# Patient Record
Sex: Female | Born: 1990 | Race: Black or African American | Hispanic: No | Marital: Single | Smoking: Never smoker
Health system: Southern US, Community
[De-identification: ages and names within clinical notes are randomized; demographics above are authoritative.]

## PROBLEM LIST (undated history)

## (undated) DIAGNOSIS — D649 Anemia, unspecified: Secondary | ICD-10-CM

## (undated) HISTORY — PX: THERAPEUTIC ABORTION: SHX798

---
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
## 2012-06-18 ENCOUNTER — Emergency Department (HOSPITAL_COMMUNITY)
Admission: EM | Admit: 2012-06-18 | Discharge: 2012-06-18 | Disposition: A | Discharge status: Home / Self Care | Payer: BC Managed Care – PPO | Attending: Emergency Medicine | Admitting: Emergency Medicine

## 2012-06-18 ENCOUNTER — Encounter (HOSPITAL_COMMUNITY): Payer: Self-pay | Admitting: *Deleted

## 2012-06-18 DIAGNOSIS — K0889 Other specified disorders of teeth and supporting structures: Secondary | ICD-10-CM

## 2012-06-18 DIAGNOSIS — K029 Dental caries, unspecified: Secondary | ICD-10-CM | POA: Insufficient documentation

## 2012-06-18 MED ORDER — HYDROCODONE-ACETAMINOPHEN 5-325 MG PO TABS: 1.0000 | ORAL_TABLET | Freq: Four times a day (QID) | ORAL | Status: DC | PRN

## 2012-06-18 MED ORDER — CLINDAMYCIN HCL 150 MG PO CAPS: 150.0000 mg | ORAL_CAPSULE | Freq: Four times a day (QID) | ORAL | Status: DC

## 2012-06-18 NOTE — ED Notes (Signed)
Pt st's now pain has subsided and wants to know if she should just leave.  Encouraged pt to stay due to possibility of pain returning during the night.  Pt voices understanding and st's she will stay.

## 2012-06-18 NOTE — ED Provider Notes (Signed)
History  This chart was scribed for Shelda Jakes, MD by Shari Heritage. The patient was seen in room TR02C/TR02C. Patient's care was started at 2055.     CSN: [id number]  Arrival date & time 06/18/12  2023   First MD Initiated Contact with Patient 06/18/12 2055      Chief Complaint  Patient presents with  . Dental Pain    Patient is a 21 y.o. female presenting with tooth pain. The history is provided by the patient. No language interpreter was used.  Dental PainThe primary symptoms include mouth pain, dental injury and headaches. Primary symptoms do not include fever or shortness of breath. The symptoms began yesterday. The symptoms are unchanged. The symptoms are recurrent. The symptoms occur constantly.  Mouth pain began 24 -48 hours ago. Mouth pain occurs constantly. Mouth pain is worsening. Affected locations include: teeth.  Additional symptoms do not include: gum swelling and jaw pain. Medical issues do not include: smoking.    Denise Terry is a 21 y.o. female who presents to the Emergency Department complaining of moderate, constant left lower tooth pain that radiates to the neck and and head onset 1 day ago. Pateint states that she has a known crack in her tooth and needs to have the tooth pulled. She states that she applied Orajel and took Aleve 250 mg and is now having moderate relief from the pain. Patient denies fever, SOB, chest pain and abdominal pain. She reports no other significant past medical or surgical history. She has never smoked.   History reviewed. No pertinent past medical history.  History reviewed. No pertinent past surgical history.  History reviewed. No pertinent family history.  History  Substance Use Topics  . Smoking status: Never Smoker   . Smokeless tobacco: Not on file  . Alcohol Use: No    OB History    Grav Para Term Preterm Abortions TAB SAB Ect Mult Living                  Review of Systems  Constitutional: Negative for  fever.  HENT: Positive for neck pain and dental problem.   Respiratory: Negative for shortness of breath.   Cardiovascular: Negative for chest pain.  Gastrointestinal: Negative for abdominal pain.  Neurological: Positive for headaches.    Allergies  Penicillins  Home Medications   Current Outpatient Rx  Name Route Sig Dispense Refill  . NAPROXEN SODIUM 220 MG PO TABS Oral Take 220 mg by mouth 2 (two) times daily as needed. For pain    . CLINDAMYCIN HCL 150 MG PO CAPS Oral Take 1 capsule (150 mg total) by mouth every 6 (six) hours. 28 capsule 0  . HYDROCODONE-ACETAMINOPHEN 5-325 MG PO TABS Oral Take 1-2 tablets by mouth every 6 (six) hours as needed for pain. 14 tablet 0    BP 116/58  Pulse 99  Temp 98.3 F (36.8 C) (Oral)  Resp 16  SpO2 98%  Physical Exam  Constitutional: She appears well-developed and well-nourished.  HENT:  Head: Normocephalic and atraumatic.       No jaw swelling. No evidence of apical abscess. No gum swelling.  2nd to last left lower molar has a fracture on the back side of the tooth with decay.  Cardiovascular: Normal rate and regular rhythm.   No murmur heard. Pulmonary/Chest: Effort normal and breath sounds normal. No respiratory distress. She has no wheezes. She has no rales.  Abdominal: Soft. Bowel sounds are normal. There is no tenderness.  Lymphadenopathy:    She has no cervical adenopathy.    ED Course  Procedures (including critical care time) DIAGNOSTIC STUDIES: Oxygen Saturation is 98% on room air, normal by my interpretation.    COORDINATION OF CARE: 9:18pm- Patient informed of current plan for treatment and evaluation and agrees with plan at this time.      Labs Reviewed - No data to display No results found.   1. Toothache       MDM  No significant evidence of a dental abscess however patient does have a fractured decayed left lower right molar. Will treat with antibiotics anti-inflammatories she started taking Aleve at  home gave her the full dose recommendation. And will provide with hydrocodone for breakthrough pain. Patient is followed with dentistry.      I personally performed the services described in this documentation, which was scribed in my presence. The recorded information has been reviewed and considered.     Shelda Jakes, MD 06/18/12 2125

## 2012-06-18 NOTE — ED Notes (Signed)
Pt states 2 days of dental pain. Pt was told to have tooth pulled 3 months ago and did not have the money to. Pt states hole next to tooth. Pt state OTC medication and have not helped.

## 2013-09-05 NOTE — L&D Delivery Note (Signed)
Delivery Note At 8:42 PM a viable female was delivered via Vaginal, Spontaneous Delivery (Presentation: ; Occiput Anterior).  APGAR: 8, 9; weight 7 lb 2.6 oz (3250 g).   Placenta status: Intact, Spontaneous.  Cord: 3 vessels with the following complications: None.  Cord pH: NA  Anesthesia: Epidural  Episiotomy: None Lacerations: 1st degree;Perineal Suture Repair: 3.0 vicryl Est. Blood Loss (mL): 300  Mom to postpartum.  Baby to Couplet care / Skin to Skin.  Daryn Hicks J. 07/19/2014, 9:09 AM

## 2013-12-16 LAB — OB RESULTS CONSOLE HIV ANTIBODY (ROUTINE TESTING): HIV: NONREACTIVE

## 2013-12-16 LAB — OB RESULTS CONSOLE ABO/RH: RH TYPE: POSITIVE

## 2013-12-16 LAB — OB RESULTS CONSOLE HEPATITIS B SURFACE ANTIGEN: Hepatitis B Surface Ag: NEGATIVE

## 2013-12-16 LAB — OB RESULTS CONSOLE RUBELLA ANTIBODY, IGM: RUBELLA: IMMUNE

## 2013-12-16 LAB — OB RESULTS CONSOLE ANTIBODY SCREEN: Antibody Screen: NEGATIVE

## 2013-12-16 LAB — OB RESULTS CONSOLE RPR: RPR: NONREACTIVE

## 2013-12-31 ENCOUNTER — Other Ambulatory Visit (HOSPITAL_COMMUNITY)
Admission: RE | Admit: 2013-12-31 | Discharge: 2013-12-31 | Disposition: A | Discharge status: Home / Self Care | Payer: BC Managed Care – PPO | Source: Ambulatory Visit | Attending: Obstetrics and Gynecology | Admitting: Obstetrics and Gynecology

## 2013-12-31 ENCOUNTER — Other Ambulatory Visit: Payer: Self-pay | Admitting: Obstetrics and Gynecology

## 2013-12-31 DIAGNOSIS — Z113 Encounter for screening for infections with a predominantly sexual mode of transmission: Secondary | ICD-10-CM | POA: Insufficient documentation

## 2013-12-31 DIAGNOSIS — Z124 Encounter for screening for malignant neoplasm of cervix: Secondary | ICD-10-CM | POA: Insufficient documentation

## 2014-07-07 LAB — OB RESULTS CONSOLE GBS: GBS: POSITIVE

## 2014-07-18 ENCOUNTER — Inpatient Hospital Stay (HOSPITAL_COMMUNITY): Payer: BC Managed Care – PPO | Admitting: Anesthesiology

## 2014-07-18 ENCOUNTER — Encounter (HOSPITAL_COMMUNITY): Payer: Self-pay | Admitting: *Deleted

## 2014-07-18 ENCOUNTER — Inpatient Hospital Stay (HOSPITAL_COMMUNITY)
Admission: AD | Admit: 2014-07-18 | Discharge: 2014-07-20 | DRG: 775 | Disposition: A | Discharge status: Home / Self Care | Payer: BC Managed Care – PPO | Source: Ambulatory Visit | Attending: Obstetrics and Gynecology | Admitting: Obstetrics and Gynecology

## 2014-07-18 DIAGNOSIS — Z8249 Family history of ischemic heart disease and other diseases of the circulatory system: Secondary | ICD-10-CM

## 2014-07-18 DIAGNOSIS — O471 False labor at or after 37 completed weeks of gestation: Secondary | ICD-10-CM | POA: Diagnosis present

## 2014-07-18 DIAGNOSIS — Z3A38 38 weeks gestation of pregnancy: Secondary | ICD-10-CM | POA: Diagnosis present

## 2014-07-18 DIAGNOSIS — O99824 Streptococcus B carrier state complicating childbirth: Secondary | ICD-10-CM | POA: Diagnosis present

## 2014-07-18 DIAGNOSIS — IMO0001 Reserved for inherently not codable concepts without codable children: Secondary | ICD-10-CM

## 2014-07-18 DIAGNOSIS — Z833 Family history of diabetes mellitus: Secondary | ICD-10-CM

## 2014-07-18 LAB — RPR

## 2014-07-18 LAB — CBC
HEMATOCRIT: 32.3 % — AB (ref 36.0–46.0)
HEMOGLOBIN: 11 g/dL — AB (ref 12.0–15.0)
MCH: 29.2 pg (ref 26.0–34.0)
MCHC: 34.1 g/dL (ref 30.0–36.0)
MCV: 85.7 fL (ref 78.0–100.0)
Platelets: 150 10*3/uL (ref 150–400)
RBC: 3.77 MIL/uL — ABNORMAL LOW (ref 3.87–5.11)
RDW: 14.1 % (ref 11.5–15.5)
WBC: 9.1 10*3/uL (ref 4.0–10.5)

## 2014-07-18 LAB — TYPE AND SCREEN
ABO/RH(D): O POS
ANTIBODY SCREEN: NEGATIVE

## 2014-07-18 LAB — POCT FERN TEST: POCT Fern Test: POSITIVE

## 2014-07-18 MED ORDER — EPHEDRINE 5 MG/ML INJ
10.0000 mg | INTRAVENOUS | Status: DC | PRN
  Filled 2014-07-18: qty 2

## 2014-07-18 MED ORDER — PHENYLEPHRINE 40 MCG/ML (10ML) SYRINGE FOR IV PUSH (FOR BLOOD PRESSURE SUPPORT)
80.0000 ug | PREFILLED_SYRINGE | INTRAVENOUS | Status: DC | PRN
  Filled 2014-07-18: qty 2
  Filled 2014-07-18: qty 10

## 2014-07-18 MED ORDER — CITRIC ACID-SODIUM CITRATE 334-500 MG/5ML PO SOLN: 30.0000 mL | ORAL | Status: DC | PRN

## 2014-07-18 MED ORDER — OXYCODONE-ACETAMINOPHEN 5-325 MG PO TABS: 1.0000 | ORAL_TABLET | ORAL | Status: DC | PRN

## 2014-07-18 MED ORDER — PRENATAL MULTIVITAMIN CH
1.0000 | ORAL_TABLET | Freq: Every day | ORAL | Status: DC
  Filled 2014-07-18: qty 1

## 2014-07-18 MED ORDER — BUTORPHANOL TARTRATE 1 MG/ML IJ SOLN
1.0000 mg | INTRAMUSCULAR | Status: DC | PRN
Start: 2014-07-18 — End: 2014-07-18

## 2014-07-18 MED ORDER — METHYLERGONOVINE MALEATE 0.2 MG/ML IJ SOLN
0.2000 mg | Freq: Once | INTRAMUSCULAR | Status: AC
  Administered 2014-07-18: 0.2 mg via INTRAMUSCULAR

## 2014-07-18 MED ORDER — ONDANSETRON HCL 4 MG/2ML IJ SOLN: 4.0000 mg | INTRAMUSCULAR | Status: DC | PRN

## 2014-07-18 MED ORDER — LACTATED RINGERS IV SOLN
INTRAVENOUS | Status: DC
  Administered 2014-07-18 (×2): via INTRAVENOUS

## 2014-07-18 MED ORDER — DIPHENHYDRAMINE HCL 25 MG PO CAPS: 25.0000 mg | ORAL_CAPSULE | Freq: Four times a day (QID) | ORAL | Status: DC | PRN

## 2014-07-18 MED ORDER — LACTATED RINGERS IV SOLN
500.0000 mL | Freq: Once | INTRAVENOUS | Status: AC
  Administered 2014-07-18: 500 mL via INTRAVENOUS

## 2014-07-18 MED ORDER — PHENYLEPHRINE 40 MCG/ML (10ML) SYRINGE FOR IV PUSH (FOR BLOOD PRESSURE SUPPORT)
80.0000 ug | PREFILLED_SYRINGE | INTRAVENOUS | Status: DC | PRN
  Filled 2014-07-18: qty 2

## 2014-07-18 MED ORDER — CLINDAMYCIN PHOSPHATE 900 MG/50ML IV SOLN
900.0000 mg | Freq: Once | INTRAVENOUS | Status: AC
  Administered 2014-07-18: 900 mg via INTRAVENOUS
  Filled 2014-07-18: qty 50

## 2014-07-18 MED ORDER — METHYLERGONOVINE MALEATE 0.2 MG PO TABS
0.2000 mg | ORAL_TABLET | Freq: Four times a day (QID) | ORAL | Status: DC
  Administered 2014-07-19 – 2014-07-20 (×4): 0.2 mg via ORAL
  Filled 2014-07-18 (×5): qty 1

## 2014-07-18 MED ORDER — METHYLERGONOVINE MALEATE 0.2 MG/ML IJ SOLN: 0.2000 mg | Freq: Four times a day (QID) | INTRAMUSCULAR | Status: DC

## 2014-07-18 MED ORDER — SIMETHICONE 80 MG PO CHEW: 80.0000 mg | CHEWABLE_TABLET | ORAL | Status: DC | PRN

## 2014-07-18 MED ORDER — OXYTOCIN BOLUS FROM INFUSION
500.0000 mL | INTRAVENOUS | Status: DC
  Administered 2014-07-18: 500 mL via INTRAVENOUS

## 2014-07-18 MED ORDER — SENNOSIDES-DOCUSATE SODIUM 8.6-50 MG PO TABS
2.0000 | ORAL_TABLET | ORAL | Status: DC
  Administered 2014-07-19 – 2014-07-20 (×2): 2 via ORAL
  Filled 2014-07-18 (×2): qty 2

## 2014-07-18 MED ORDER — OXYCODONE-ACETAMINOPHEN 5-325 MG PO TABS: 2.0000 | ORAL_TABLET | ORAL | Status: DC | PRN

## 2014-07-18 MED ORDER — OXYTOCIN 40 UNITS IN LACTATED RINGERS INFUSION - SIMPLE MED
62.5000 mL/h | INTRAVENOUS | Status: DC
  Filled 2014-07-18: qty 1000

## 2014-07-18 MED ORDER — ACETAMINOPHEN 325 MG PO TABS: 650.0000 mg | ORAL_TABLET | ORAL | Status: DC | PRN

## 2014-07-18 MED ORDER — WITCH HAZEL-GLYCERIN EX PADS: 1.0000 "application " | MEDICATED_PAD | CUTANEOUS | Status: DC | PRN

## 2014-07-18 MED ORDER — ONDANSETRON HCL 4 MG/2ML IJ SOLN: 4.0000 mg | Freq: Four times a day (QID) | INTRAMUSCULAR | Status: DC | PRN

## 2014-07-18 MED ORDER — LIDOCAINE HCL (PF) 1 % IJ SOLN
INTRAMUSCULAR | Status: DC | PRN
  Administered 2014-07-18 (×4): 4 mL

## 2014-07-18 MED ORDER — DIPHENHYDRAMINE HCL 50 MG/ML IJ SOLN: 12.5000 mg | INTRAMUSCULAR | Status: DC | PRN

## 2014-07-18 MED ORDER — LANOLIN HYDROUS EX OINT: TOPICAL_OINTMENT | CUTANEOUS | Status: DC | PRN

## 2014-07-18 MED ORDER — LIDOCAINE HCL (PF) 1 % IJ SOLN
30.0000 mL | INTRAMUSCULAR | Status: DC | PRN
  Filled 2014-07-18: qty 30

## 2014-07-18 MED ORDER — FENTANYL 2.5 MCG/ML BUPIVACAINE 1/10 % EPIDURAL INFUSION (WH - ANES)
14.0000 mL/h | INTRAMUSCULAR | Status: DC | PRN
  Administered 2014-07-18: 14 mL/h via EPIDURAL
  Filled 2014-07-18: qty 125

## 2014-07-18 MED ORDER — ONDANSETRON HCL 4 MG PO TABS: 4.0000 mg | ORAL_TABLET | ORAL | Status: DC | PRN

## 2014-07-18 MED ORDER — LACTATED RINGERS IV SOLN
500.0000 mL | INTRAVENOUS | Status: DC | PRN
  Administered 2014-07-18: 500 mL via INTRAVENOUS

## 2014-07-18 MED ORDER — IBUPROFEN 600 MG PO TABS
600.0000 mg | ORAL_TABLET | Freq: Four times a day (QID) | ORAL | Status: DC
  Administered 2014-07-19 (×2): 600 mg via ORAL
  Filled 2014-07-18 (×4): qty 1

## 2014-07-18 MED ORDER — ZOLPIDEM TARTRATE 5 MG PO TABS: 5.0000 mg | ORAL_TABLET | Freq: Every evening | ORAL | Status: DC | PRN

## 2014-07-18 MED ORDER — METHYLERGONOVINE MALEATE 0.2 MG/ML IJ SOLN
INTRAMUSCULAR | Status: AC
  Administered 2014-07-18: 0.2 mg via INTRAMUSCULAR
  Filled 2014-07-18: qty 1

## 2014-07-18 MED ORDER — BENZOCAINE-MENTHOL 20-0.5 % EX AERO: 1.0000 "application " | INHALATION_SPRAY | CUTANEOUS | Status: DC | PRN

## 2014-07-18 MED ORDER — DIBUCAINE 1 % RE OINT: 1.0000 "application " | TOPICAL_OINTMENT | RECTAL | Status: DC | PRN

## 2014-07-18 NOTE — Anesthesia Procedure Notes (Signed)
Epidural Patient location during procedure: OB Start time: 07/18/2014 6:45 PM  Staffing Anesthesiologist: Fawna Cranmer Performed by: anesthesiologist   Preanesthetic Checklist Completed: patient identified, site marked, surgical consent, pre-op evaluation, timeout performed, IV checked, risks and benefits discussed and monitors and equipment checked  Epidural Patient position: sitting Prep: site prepped and draped and DuraPrep Patient monitoring: continuous pulse ox and blood pressure Approach: midline Location: L3-L4 Injection technique: LOR air  Needle:  Needle type: Tuohy  Needle gauge: 17 G Needle length: 9 cm and 9 Needle insertion depth: 4.5 cm Catheter type: closed end flexible Catheter size: 19 Gauge Catheter at skin depth: 9.5 cm Test dose: negative  Assessment Events: blood not aspirated, injection not painful, no injection resistance, negative IV test and no paresthesia  Additional Notes Discussed risk of headache, infection, bleeding, nerve injury and failed or incomplete block.  Patient voices understanding and wishes to proceed.  Epidural placed easily on first attempt.  No paresthesia.  Patient tolerated procedure well no apparent complications.  Jasmine DecemberA. Deletha Jaffee, MDReason for block:procedure for pain

## 2014-07-18 NOTE — H&P (Signed)
Denise Terry is a 23 y.o. female G1P0 at 3538wk and 2 days by LMP with EDD 07/30/2014 presenting complaining of  SROM at 3 pm clear fluid. Contractions every 5 minutes. +_FM no vaginal bleeding.    Marland Kitchen. History OB History    Gravida Para Term Preterm AB TAB SAB Ectopic Multiple Living   1              Past Medical History  Diagnosis Date  . Medical history non-contributory    Past Surgical History  Procedure Laterality Date  . No past surgeries     Family History: family history includes Cancer in her paternal grandmother; Diabetes in her father, maternal grandfather, and maternal grandmother; Hypertension in her maternal grandfather and maternal grandmother. There is no history of Hearing loss. Social History:  reports that she has never smoked. She has never used smokeless tobacco. She reports that she does not drink alcohol or use illicit drugs.   Prenatal Transfer Tool  Maternal Diabetes: No Genetic Screening: Normal Maternal Ultrasounds/Referrals: Normal Fetal Ultrasounds or other Referrals:  None Maternal Substance Abuse:  No Significant Maternal Medications:  None Significant Maternal Lab Results:  Lab values include: Group B Strep positive Other Comments:  None  Review of Systems  All other systems reviewed and are negative.   Dilation: 4.5 Effacement (%): 90 Station: 0 Exam by:: A.Davis,RN Blood pressure 125/65, pulse 101, temperature 97.6 F (36.4 C), temperature source Oral, resp. rate 18, height 5\' 9"  (1.753 m), weight 76.658 kg (169 lb), SpO2 98 %. Maternal Exam:  Uterine Assessment: Contraction strength is moderate.  Contraction duration is 60 seconds. Contraction frequency is regular.   Abdomen: Fetal presentation: vertex  Introitus: Normal vulva. Normal vagina.    Fetal Exam Fetal Monitor Review: Mode: fetoscope.   Baseline rate: 135.  Variability: moderate (6-25 bpm).   Pattern: accelerations present and no decelerations.    Fetal State  Assessment: Category I - tracings are normal.     Physical Exam  Constitutional: She appears well-developed and well-nourished.  HENT:  Head: Normocephalic and atraumatic.  Cardiovascular: Normal rate and regular rhythm.   Respiratory: Effort normal and breath sounds normal.  GI: There is no tenderness.  Musculoskeletal: Normal range of motion. She exhibits no edema.  Skin: Skin is warm and dry.  Psychiatric: She has a normal mood and affect.   Cervix 4.5/ 90/ 0    Prenatal labs: ABO, Rh:  O positive  Antibody:  Negative  Rubella:   RPR:   Nonreactive  HBsAg:   Negative  HIV:   Nonreactive  GBS: Positive (11/02 0000)   Assessment/Plan: 38 wks and 2 days active labor SROM clear fluid GBS Positive Clindamycin  Epidural for pain control  Anticipate svd.     Denise Terry. 07/18/2014, 7:12 PM

## 2014-07-18 NOTE — MAU Note (Signed)
srom big gush of clear fluid @ 1530, still coming. No bleeding, no contractions.

## 2014-07-18 NOTE — Anesthesia Preprocedure Evaluation (Signed)

## 2014-07-19 LAB — CBC
HCT: 33.1 % — ABNORMAL LOW (ref 36.0–46.0)
Hemoglobin: 11.1 g/dL — ABNORMAL LOW (ref 12.0–15.0)
MCH: 28.6 pg (ref 26.0–34.0)
MCHC: 33.5 g/dL (ref 30.0–36.0)
MCV: 85.3 fL (ref 78.0–100.0)
Platelets: 157 10*3/uL (ref 150–400)
RBC: 3.88 MIL/uL (ref 3.87–5.11)
RDW: 14.1 % (ref 11.5–15.5)
WBC: 13.2 10*3/uL — AB (ref 4.0–10.5)

## 2014-07-19 LAB — ABO/RH: ABO/RH(D): O POS

## 2014-07-19 NOTE — Plan of Care (Signed)
Problem: Discharge Progression Outcomes Goal: Tolerating diet Outcome: Completed/Met Date Met:  07/19/14     

## 2014-07-19 NOTE — Plan of Care (Signed)
Problem: Consults Goal: Postpartum Patient Education (See Patient Education module for education specifics.)  Outcome: Completed/Met Date Met:  07/19/14 Goal: Skin Care Protocol Initiated - if Braden Score 18 or less If consults are not indicated, leave blank or document N/A  Outcome: Completed/Met Date Met:  07/19/14  Problem: Phase I Progression Outcomes Goal: Pain controlled with appropriate interventions Outcome: Completed/Met Date Met:  07/19/14 Goal: Voiding adequately Outcome: Completed/Met Date Met:  07/19/14 Goal: OOB as tolerated unless otherwise ordered Outcome: Completed/Met Date Met:  07/19/14 Goal: VS, stable, temp < 100.4 degrees F Outcome: Completed/Met Date Met:  07/19/14 Goal: Initial discharge plan identified Outcome: Not Applicable Date Met:  21/22/48 Goal: Other Phase I Outcomes/Goals Outcome: Completed/Met Date Met:  07/19/14

## 2014-07-19 NOTE — Progress Notes (Signed)
Post Partum Day 1 s/p SVD Subjective: up ad lib, voiding and tolerating PO  Objective: Blood pressure 113/63, pulse 102, temperature 98 F (36.7 C), temperature source Oral, resp. rate 18, height 5\' 9"  (1.753 m), weight 76.658 kg (169 lb), SpO2 99 %, unknown if currently breastfeeding.  Physical Exam:  General: alert and cooperative Lochia: appropriate Uterine Fundus: firm Incision: NA DVT Evaluation: No evidence of DVT seen on physical exam.   Recent Labs  07/18/14 1710 07/19/14 0555  HGB 11.0* 11.1*  HCT 32.3* 33.1*    Assessment/Plan: Plan for discharge tomorrow and Breastfeeding  May remove IV    LOS: 1 day   Americus Perkey J. 07/19/2014, 9:11 AM

## 2014-07-19 NOTE — Anesthesia Postprocedure Evaluation (Signed)
Anesthesia Post Note  Patient: Denise Terry  Procedure(s) Performed: * No procedures listed *  Anesthesia type: Epidural  Patient location: Mother/Baby  Post pain: Pain level controlled  Post assessment: Post-op Vital signs reviewed  Last Vitals:  Filed Vitals:   07/19/14 0500  BP: 113/63  Pulse: 102  Temp: 36.7 C  Resp: 18    Post vital signs: Reviewed  Level of consciousness:alert  Complications: No apparent anesthesia complications

## 2014-07-19 NOTE — Plan of Care (Signed)
Problem: Phase II Progression Outcomes Goal: Pain controlled on oral analgesia Outcome: Completed/Met Date Met:  07/19/14 Goal: Progress activity as tolerated unless otherwise ordered Outcome: Completed/Met Date Met:  07/19/14 Goal: Afebrile, VS remain stable Outcome: Completed/Met Date Met:  07/19/14 Goal: Tolerating diet Outcome: Completed/Met Date Met:  07/19/14 Goal: Other Phase II Outcomes/Goals Outcome: Completed/Met Date Met:  07/19/14

## 2014-07-20 ENCOUNTER — Ambulatory Visit: Payer: Self-pay

## 2014-07-20 MED ORDER — IBUPROFEN 100 MG/5ML PO SUSP: 600.0000 mg | Freq: Four times a day (QID) | ORAL | Status: DC

## 2014-07-20 MED ORDER — IBUPROFEN 100 MG/5ML PO SUSP
600.0000 mg | Freq: Four times a day (QID) | ORAL | Status: DC
  Administered 2014-07-20 (×2): 600 mg via ORAL
  Filled 2014-07-20 (×6): qty 30

## 2014-07-20 NOTE — Lactation Note (Deleted)
This note was copied from the chart of Denise Leanne Changrica Simonet. Lactation Consultation Note  Provided mother with soap and labels.  Mother has been pumping every 3 hours. Discussed massaging and hand expressing before and after pumping. Mother had question about areola swelling and explained it will go down when other swollen areas of her body decrease. Edema around areola happens when mothers get lots of IV fluids. Praised her for being committed to pumping.  Patient Name: Denise Terry [id number]'s Date: 07/20/2014     Maternal Data    Feeding Feeding Type: Bottle Fed - Formula Nipple Type: Slow - flow  LATCH Score/Interventions                      Lactation Tools Discussed/Used     Consult Status      Hardie PulleyBerkelhammer, Breshae Belcher Boschen 07/20/2014, 2:44 PM

## 2014-07-20 NOTE — Plan of Care (Signed)
Problem: Discharge Progression Outcomes Goal: Activity appropriate for discharge plan Outcome: Completed/Met Date Met:  07/20/14 Goal: Pain controlled with appropriate interventions Outcome: Completed/Met Date Met:  07/20/14

## 2014-07-20 NOTE — Discharge Summary (Signed)
Obstetric Discharge Summary Reason for Admission: onset of labor Prenatal Procedures: none Intrapartum Procedures: spontaneous vaginal delivery Postpartum Procedures: none Complications-Operative and Postpartum: none HEMOGLOBIN  Date Value Ref Range Status  07/19/2014 11.1* 12.0 - 15.0 g/dL Final   HCT  Date Value Ref Range Status  07/19/2014 33.1* 36.0 - 46.0 % Final    Physical Exam:  General: alert and cooperative Lochia: appropriate Uterine Fundus: firm Incision: NA DVT Evaluation: No evidence of DVT seen on physical exam.  Discharge Diagnoses: Term Pregnancy-delivered  Discharge Information: Date: 07/20/2014 Activity: pelvic rest Diet: routine Medications: PNV and Ibuprofen Condition: stable Instructions: refer to practice specific booklet Discharge to: home Follow-up Information    Follow up with Jessee AversOLE,Belva Koziel J., MD. Schedule an appointment as soon as possible for a visit in 6 weeks.   Specialty:  Obstetrics and Gynecology   Why:  postpartum visit    Contact information:   [address] [place] [address] [phone]       Newborn Data: Live born female  Birth Weight: 7 lb 2.6 oz (3250 g) APGAR: 8, 9  Home with mother.  Denise Terry J. 07/20/2014, 8:35 AM

## 2015-02-03 ENCOUNTER — Other Ambulatory Visit: Payer: Self-pay | Admitting: Obstetrics and Gynecology

## 2015-02-03 ENCOUNTER — Other Ambulatory Visit (HOSPITAL_COMMUNITY)
Admission: RE | Admit: 2015-02-03 | Discharge: 2015-02-03 | Disposition: A | Discharge status: Home / Self Care | Payer: Medicaid Other | Source: Ambulatory Visit | Attending: Obstetrics and Gynecology | Admitting: Obstetrics and Gynecology

## 2015-02-03 DIAGNOSIS — Z01419 Encounter for gynecological examination (general) (routine) without abnormal findings: Secondary | ICD-10-CM | POA: Insufficient documentation

## 2015-02-03 DIAGNOSIS — Z113 Encounter for screening for infections with a predominantly sexual mode of transmission: Secondary | ICD-10-CM | POA: Insufficient documentation

## 2015-02-05 LAB — CYTOLOGY - PAP

## 2016-06-04 ENCOUNTER — Observation Stay (HOSPITAL_COMMUNITY)
Admission: AD | Admit: 2016-06-04 | Discharge: 2016-06-05 | Disposition: A | Discharge status: Home / Self Care | Payer: BLUE CROSS/BLUE SHIELD | Source: Ambulatory Visit | Attending: Obstetrics and Gynecology | Admitting: Obstetrics and Gynecology

## 2016-06-04 ENCOUNTER — Encounter (HOSPITAL_COMMUNITY)
Admission: AD | Disposition: A | Discharge status: Home / Self Care | Payer: Self-pay | Source: Ambulatory Visit | Attending: Obstetrics and Gynecology

## 2016-06-04 ENCOUNTER — Inpatient Hospital Stay (HOSPITAL_COMMUNITY): Payer: BLUE CROSS/BLUE SHIELD | Admitting: Anesthesiology

## 2016-06-04 ENCOUNTER — Inpatient Hospital Stay (HOSPITAL_COMMUNITY): Payer: BLUE CROSS/BLUE SHIELD

## 2016-06-04 ENCOUNTER — Encounter (HOSPITAL_COMMUNITY): Payer: Self-pay

## 2016-06-04 ENCOUNTER — Inpatient Hospital Stay: Admit: 2016-06-04 | Payer: BLUE CROSS/BLUE SHIELD | Admitting: Obstetrics & Gynecology

## 2016-06-04 DIAGNOSIS — Z880 Allergy status to penicillin: Secondary | ICD-10-CM | POA: Insufficient documentation

## 2016-06-04 DIAGNOSIS — O030 Genital tract and pelvic infection following incomplete spontaneous abortion: Secondary | ICD-10-CM | POA: Insufficient documentation

## 2016-06-04 DIAGNOSIS — O034 Incomplete spontaneous abortion without complication: Secondary | ICD-10-CM

## 2016-06-04 DIAGNOSIS — Z3A08 8 weeks gestation of pregnancy: Secondary | ICD-10-CM | POA: Insufficient documentation

## 2016-06-04 DIAGNOSIS — N719 Inflammatory disease of uterus, unspecified: Secondary | ICD-10-CM | POA: Diagnosis not present

## 2016-06-04 DIAGNOSIS — O0380 Unspecified complication following complete or unspecified spontaneous abortion: Secondary | ICD-10-CM

## 2016-06-04 HISTORY — PX: DILATION AND EVACUATION: SHX1459

## 2016-06-04 HISTORY — DX: Incomplete spontaneous abortion without complication: O03.4

## 2016-06-04 HISTORY — DX: Anemia, unspecified: D64.9

## 2016-06-04 LAB — CBC WITH DIFFERENTIAL/PLATELET
Basophils Absolute: 0 10*3/uL (ref 0.0–0.1)
Basophils Relative: 1 %
Eosinophils Absolute: 0 10*3/uL (ref 0.0–0.7)
Eosinophils Relative: 0 %
HCT: 30.3 % — ABNORMAL LOW (ref 36.0–46.0)
Hemoglobin: 10.2 g/dL — ABNORMAL LOW (ref 12.0–15.0)
LYMPHS ABS: 1.5 10*3/uL (ref 0.7–4.0)
LYMPHS PCT: 44 %
MCH: 28.7 pg (ref 26.0–34.0)
MCHC: 33.7 g/dL (ref 30.0–36.0)
MCV: 85.4 fL (ref 78.0–100.0)
MONOS PCT: 10 %
Monocytes Absolute: 0.3 10*3/uL (ref 0.1–1.0)
Neutro Abs: 1.5 10*3/uL — ABNORMAL LOW (ref 1.7–7.7)
Neutrophils Relative %: 45 %
Platelets: 205 10*3/uL (ref 150–400)
RBC: 3.55 MIL/uL — AB (ref 3.87–5.11)
RDW: 14.3 % (ref 11.5–15.5)
WBC: 3.3 10*3/uL — AB (ref 4.0–10.5)

## 2016-06-04 LAB — URINALYSIS, ROUTINE W REFLEX MICROSCOPIC
Glucose, UA: NEGATIVE mg/dL
KETONES UR: 15 mg/dL — AB
Leukocytes, UA: NEGATIVE
NITRITE: NEGATIVE
PH: 6 (ref 5.0–8.0)
Protein, ur: 100 mg/dL — AB

## 2016-06-04 LAB — LACTIC ACID, PLASMA: LACTIC ACID, VENOUS: 0.6 mmol/L (ref 0.5–1.9)

## 2016-06-04 LAB — BASIC METABOLIC PANEL
Anion gap: 8 (ref 5–15)
BUN: 9 mg/dL (ref 6–20)
CHLORIDE: 99 mmol/L — AB (ref 101–111)
CO2: 29 mmol/L (ref 22–32)
CREATININE: 0.64 mg/dL (ref 0.44–1.00)
Calcium: 8.5 mg/dL — ABNORMAL LOW (ref 8.9–10.3)
GFR calc Af Amer: >60 mL/min (ref >60)
GFR calc non Af Amer: >60 mL/min (ref >60)
GLUCOSE: 116 mg/dL — AB (ref 65–99)
POTASSIUM: 3.1 mmol/L — AB (ref 3.5–5.1)
Sodium: 136 mmol/L (ref 135–145)

## 2016-06-04 LAB — URINE MICROSCOPIC-ADD ON

## 2016-06-04 LAB — POCT PREGNANCY, URINE: Preg Test, Ur: POSITIVE — AB

## 2016-06-04 SURGERY — DILATION AND EVACUATION, UTERUS
Anesthesia: Spinal | Site: Vagina

## 2016-06-04 MED ORDER — FENTANYL CITRATE (PF) 250 MCG/5ML IJ SOLN
INTRAMUSCULAR | Status: AC
  Filled 2016-06-04: qty 5

## 2016-06-04 MED ORDER — SODIUM CHLORIDE 0.9 % IV SOLN
INTRAVENOUS | Status: DC
  Administered 2016-06-04 (×2): via INTRAVENOUS

## 2016-06-04 MED ORDER — KETOROLAC TROMETHAMINE 30 MG/ML IJ SOLN
30.0000 mg | Freq: Once | INTRAMUSCULAR | Status: AC
  Administered 2016-06-04: 30 mg via INTRAVENOUS
  Filled 2016-06-04: qty 1

## 2016-06-04 MED ORDER — IBUPROFEN 600 MG PO TABS: 600.0000 mg | ORAL_TABLET | Freq: Four times a day (QID) | ORAL | Status: DC | PRN

## 2016-06-04 MED ORDER — FAMOTIDINE IN NACL 20-0.9 MG/50ML-% IV SOLN
20.0000 mg | Freq: Once | INTRAVENOUS | Status: AC
  Administered 2016-06-04: 20 mg via INTRAVENOUS

## 2016-06-04 MED ORDER — SOD CITRATE-CITRIC ACID 500-334 MG/5ML PO SOLN
ORAL | Status: AC
  Administered 2016-06-04: 30 mL via ORAL
  Filled 2016-06-04: qty 15

## 2016-06-04 MED ORDER — BUPIVACAINE-EPINEPHRINE 0.5% -1:200000 IJ SOLN
INTRAMUSCULAR | Status: DC | PRN
  Administered 2016-06-04: 10 mL

## 2016-06-04 MED ORDER — DOXYCYCLINE HYCLATE 100 MG IV SOLR
100.0000 mg | Freq: Two times a day (BID) | INTRAVENOUS | Status: DC
  Administered 2016-06-04 – 2016-06-05 (×2): 100 mg via INTRAVENOUS
  Filled 2016-06-04 (×3): qty 100

## 2016-06-04 MED ORDER — MIDAZOLAM HCL 2 MG/2ML IJ SOLN
INTRAMUSCULAR | Status: DC | PRN
  Administered 2016-06-04: 2 mg via INTRAVENOUS

## 2016-06-04 MED ORDER — FENTANYL CITRATE (PF) 100 MCG/2ML IJ SOLN: 25.0000 ug | INTRAMUSCULAR | Status: DC | PRN

## 2016-06-04 MED ORDER — FAMOTIDINE IN NACL 20-0.9 MG/50ML-% IV SOLN
INTRAVENOUS | Status: AC
  Administered 2016-06-04: 20 mg via INTRAVENOUS
  Filled 2016-06-04: qty 50

## 2016-06-04 MED ORDER — DEXTROSE 5 % IV SOLN
2.0000 g | Freq: Two times a day (BID) | INTRAVENOUS | Status: DC
  Administered 2016-06-04 – 2016-06-05 (×2): 2 g via INTRAVENOUS
  Filled 2016-06-04 (×3): qty 2

## 2016-06-04 MED ORDER — LACTATED RINGERS IV SOLN
INTRAVENOUS | Status: DC | PRN
  Administered 2016-06-04: 16:00:00 via INTRAVENOUS

## 2016-06-04 MED ORDER — LACTATED RINGERS IV SOLN
INTRAVENOUS | Status: DC
  Administered 2016-06-04: 19:00:00 via INTRAVENOUS

## 2016-06-04 MED ORDER — FENTANYL CITRATE (PF) 100 MCG/2ML IJ SOLN
INTRAMUSCULAR | Status: DC | PRN
Start: 2016-06-04 — End: 2016-06-04
  Administered 2016-06-04 (×2): 50 ug via INTRAVENOUS

## 2016-06-04 MED ORDER — MIDAZOLAM HCL 2 MG/2ML IJ SOLN
INTRAMUSCULAR | Status: AC
  Filled 2016-06-04: qty 2

## 2016-06-04 MED ORDER — OXYCODONE-ACETAMINOPHEN 5-325 MG PO TABS
1.0000 | ORAL_TABLET | ORAL | Status: DC | PRN
  Administered 2016-06-04: 1 via ORAL
  Filled 2016-06-04: qty 1

## 2016-06-04 MED ORDER — SOD CITRATE-CITRIC ACID 500-334 MG/5ML PO SOLN
30.0000 mL | Freq: Once | ORAL | Status: AC
  Administered 2016-06-04: 30 mL via ORAL

## 2016-06-04 SURGICAL SUPPLY — 19 items
CATH ROBINSON RED A/P 16FR (CATHETERS) ×3 IMPLANT
CLOTH BEACON ORANGE TIMEOUT ST (SAFETY) ×3 IMPLANT
DECANTER SPIKE VIAL GLASS SM (MISCELLANEOUS) ×3 IMPLANT
GLOVE BIO SURGEON STRL SZ 6.5 (GLOVE) ×2 IMPLANT
GLOVE BIO SURGEONS STRL SZ 6.5 (GLOVE) ×1
GLOVE BIOGEL PI IND STRL 7.0 (GLOVE) ×2 IMPLANT
GLOVE BIOGEL PI INDICATOR 7.0 (GLOVE) ×4
GOWN STRL REUS W/TWL LRG LVL3 (GOWN DISPOSABLE) ×6 IMPLANT
KIT BERKELEY 1ST TRIMESTER 3/8 (MISCELLANEOUS) ×3 IMPLANT
NS IRRIG 1000ML POUR BTL (IV SOLUTION) ×3 IMPLANT
PACK VAGINAL MINOR WOMEN LF (CUSTOM PROCEDURE TRAY) ×3 IMPLANT
PAD OB MATERNITY 4.3X12.25 (PERSONAL CARE ITEMS) ×3 IMPLANT
PAD PREP 24X48 CUFFED NSTRL (MISCELLANEOUS) ×3 IMPLANT
SET BERKELEY SUCTION TUBING (SUCTIONS) ×3 IMPLANT
TOWEL OR 17X24 6PK STRL BLUE (TOWEL DISPOSABLE) ×6 IMPLANT
VACURETTE 10 RIGID CVD (CANNULA) IMPLANT
VACURETTE 7MM CVD STRL WRAP (CANNULA) IMPLANT
VACURETTE 8 RIGID CVD (CANNULA) ×3 IMPLANT
VACURETTE 9 RIGID CVD (CANNULA) IMPLANT

## 2016-06-04 NOTE — H&P (Addendum)
Denise Terry is an 25 y.o. female 1 week post op suction Dilation and currettage for elective termination of pregnancy (at [redacted] weeks EGA) presenting with abdominal pain unresponsive to over counter pain meds.  Patient also reports having fevers, chills and night sweats at home. Reports small amount of vaginal bleeding.   Patient's last menstrual period was 03/30/2016 (exact date).    Past Medical History:  Diagnosis Date  . Anemia   . Medical history non-contributory     Past Surgical History:  Procedure Laterality Date  . NO PAST SURGERIES    . THERAPEUTIC ABORTION      Family History  Problem Relation Age of Onset  . Diabetes Father   . Diabetes Maternal Grandmother   . Hypertension Maternal Grandmother   . Diabetes Maternal Grandfather   . Hypertension Maternal Grandfather   . Cancer Paternal Grandmother     breast  . Hearing loss Neg Hx     Social History:  reports that she has never smoked. She has never used smokeless tobacco. She reports that she does not drink alcohol or use drugs.  Allergies:  Allergies  Allergen Reactions  . Penicillins Hives and Rash    Has patient had a PCN reaction causing immediate rash, facial/tongue/throat swelling, SOB or lightheadedness with hypotension: NO Has patient had a PCN reaction causing severe rash involving mucus membranes or skin necrosis: No Has patient had a PCN reaction that required hospitalization No Has patient had a PCN reaction occurring within the last 10 years: No If all of the above answers are "NO", then may proceed with Cephalosporin use.     No prescriptions prior to admission.    ROS Constitutional: + fever, chills.  Cardiovascular: Denies chest pain or palpitations Pulmonary: Denies coughing or wheezing Gastrointestinal: Denies nausea or vomiting.  With diarrhea x 3 days.  Genitourinary: Denies unusual vaginal bleeding, unusual vaginal discharge, dysuria, urgency or frequency. + pelvic pain.    Musculoskeletal: Denies muscle or joint aches and pain.  Neurology: Denies abnormal sensations such as tingling or numbness.   Blood pressure (!) 82/54, pulse 91, temperature 98 F (36.7 C), temperature source Oral, resp. rate 16, height 5\' 9"  (1.753 m), weight 62.1 kg (136 lb 12.8 oz), last menstrual period 03/30/2016, unknown if currently breastfeeding. Physical Exam   Constitutional: She is oriented to person, place, and time. She appears well-developed and well-nourished.  HENT:  Head: Normocephalic and atraumatic.  Neck: Normal range of motion.  Cardiovascular: Normal rate, regular rhythm and normal heart sounds.   Respiratory: Effort normal and breath sounds normal.  GI: Soft. Bowel sounds are normal. tender to palpation over uterus especially right side, no rebound, no guarding.  Neurological: She is alert and oriented to person, place, and time.  Skin: Skin is warm and dry.  Psychiatric: She has a normal mood and affect. Her behavior is normal.   Results for orders placed or performed during the hospital encounter of 06/04/16 (from the past 24 hour(s))  Urinalysis, Routine w reflex microscopic (not at [facility])     Status: Abnormal   Collection Time: 06/04/16 11:45 AM  Result Value Ref Range   Color, Urine YELLOW YELLOW   APPearance CLEAR CLEAR   Specific Gravity, Urine >1.030 (H) 1.005 - 1.030   pH 6.0 5.0 - 8.0   Glucose, UA NEGATIVE NEGATIVE mg/dL   Hgb urine dipstick MODERATE (A) NEGATIVE   Bilirubin Urine SMALL (A) NEGATIVE   Ketones, ur 15 (A) NEGATIVE mg/dL  Protein, ur 100 (A) NEGATIVE mg/dL   Nitrite NEGATIVE NEGATIVE   Leukocytes, UA NEGATIVE NEGATIVE  Urine microscopic-add on     Status: Abnormal   Collection Time: 06/04/16 11:45 AM  Result Value Ref Range   Squamous Epithelial / LPF 0-5 (A) NONE SEEN   WBC, UA 0-5 0 - 5 WBC/hpf   RBC / HPF 0-5 0 - 5 RBC/hpf   Bacteria, UA FEW (A) NONE SEEN  Pregnancy, urine POC     Status: Abnormal   Collection Time:  06/04/16 11:55 AM  Result Value Ref Range   Preg Test, Ur POSITIVE (A) NEGATIVE  CBC with Differential/Platelet     Status: Abnormal   Collection Time: 06/04/16 12:19 PM  Result Value Ref Range   WBC 3.3 (L) 4.0 - 10.5 K/uL   RBC 3.55 (L) 3.87 - 5.11 MIL/uL   Hemoglobin 10.2 (L) 12.0 - 15.0 g/dL   HCT 16.1 (L) 09.6 - 04.5 %   MCV 85.4 78.0 - 100.0 fL   MCH 28.7 26.0 - 34.0 pg   MCHC 33.7 30.0 - 36.0 g/dL   RDW 40.9 81.1 - 91.4 %   Platelets 205 150 - 400 K/uL   Neutrophils Relative % 45 %   Neutro Abs 1.5 (L) 1.7 - 7.7 K/uL   Lymphocytes Relative 44 %   Lymphs Abs 1.5 0.7 - 4.0 K/uL   Monocytes Relative 10 %   Monocytes Absolute 0.3 0.1 - 1.0 K/uL   Eosinophils Relative 0 %   Eosinophils Absolute 0.0 0.0 - 0.7 K/uL   Basophils Relative 1 %   Basophils Absolute 0.0 0.0 - 0.1 K/uL  Basic metabolic panel     Status: Abnormal   Collection Time: 06/04/16 12:19 PM  Result Value Ref Range   Sodium 136 135 - 145 mmol/L   Potassium 3.1 (L) 3.5 - 5.1 mmol/L   Chloride 99 (L) 101 - 111 mmol/L   CO2 29 22 - 32 mmol/L   Glucose, Bld 116 (H) 65 - 99 mg/dL   BUN 9 6 - 20 mg/dL   Creatinine, Ser 7.82 0.44 - 1.00 mg/dL   Calcium 8.5 (L) 8.9 - 10.3 mg/dL   GFR calc non Af Amer >60 >60 mL/min   GFR calc Af Amer >60 >60 mL/min   Anion gap 8 5 - 15  Lactic acid, plasma     Status: None   Collection Time: 06/04/16  1:06 PM  Result Value Ref Range   Lactic Acid, Venous 0.6 0.5 - 1.9 mmol/L    US Pelvis Complete  Result Date: 06/04/2016 CLINICAL DATA:  Uterine tenderness.  One week status post abortion. EXAM: TRANSABDOMINAL ULTRASOUND OF PELVIS TECHNIQUE: Transabdominal ultrasound examination of the pelvis was performed including evaluation of the uterus, ovaries, adnexal regions, and pelvic cul-de-sac. COMPARISON:  None. FINDINGS: Uterus Measurements: 10.7 x 7.1 x 0.3 cm. No fibroids or other mass visualized. Endometrium Thickness: 18.2 mm. Thickened and heterogeneous with increased  peripheral blood flow. Right ovary Measurements: 4.6 x 2.2 x 2.7 cm. Normal appearance/no adnexal mass. Left ovary Measurements: 2.9 x 1.5 x 1.6 cm. Normal appearance/no adnexal mass. Other findings:  No abnormal free fluid. IMPRESSION: 1. Thickened heterogeneous endometrium with increased blood flow. Retained products of conception is possible. Given the increased blood flow and significant tenderness, endometritis should be considered. 2. No other abnormalities. Electronically Signed   By: Amie Portland M.D.   On: 06/04/2016 13:42    Assessment/Plan: 25 y/o P1 with retained products of  conception and possible endometritis after elective termination of pregnancy by suction Dilation and currettage, with signs and symptoms notable for high pulse, low BPs and WBC of 3 concerning for sepsis, will proceed with procedure emergently- for suction dilation and currettage under ultrasound guidance -Blood cultures X 2 -Discussed with patient risks, benefits and alternatives of procedure including risks of bleeding, infection, damage to organs, Asherman's syndrome.  -Antibiotics start preop and continue in post op period.  -Plan for in hospital observation after procedure.  Konrad FelixKULWA,Kember Boch WAKURU, MD.  06/04/2016, 3:22 PM

## 2016-06-04 NOTE — Anesthesia Preprocedure Evaluation (Signed)

## 2016-06-04 NOTE — Brief Op Note (Signed)
06/04/2016  4:42 PM  PATIENT:  Denise Terry  25 y.o. female  PRE-OPERATIVE DIAGNOSIS:   1. Retained products of conception. 2. Post-abortion endometritis 3. Concern for sepsis   POST-OPERATIVE DIAGNOSIS: Same as above.   PROCEDURE:  Procedure(s): DILATATION AND EVACUATION (N/A) with ultrasound guidance.  SURGEON:  Surgeon(s) and Role:    * Hoover BrownsEma Tirrell Buchberger, MD - Primary  ASSISTANTS: Scrub technician- Kim.   ANESTHESIA:   regional- Spinal, Dr. Jean RosenthalJackson.   EBL:  Total I/O In: 500 [I.V.:500] Out: 30 [Blood:30]  BLOOD ADMINISTERED:none  DRAINS: none   LOCAL MEDICATIONS USED:  MARCAINE with epinephrine, Amount: 10 ml.   SPECIMEN:  Source of Specimen:  Products of conception.   DISPOSITION OF SPECIMEN:  PATHOLOGY  COUNTS:  YES  TOURNIQUET:  * No tourniquets in log *  DICTATION: .Note written in EPIC  PLAN OF CARE: Admit for overnight observation  PATIENT DISPOSITION:  PACU - hemodynamically stable.   Delay start of Pharmacological VTE agent (>24hrs) due to surgical blood loss or risk of bleeding: not applicable

## 2016-06-04 NOTE — Transfer of Care (Signed)
Immediate Anesthesia Transfer of Care Note  Patient: Corey Haroldrica G Doverspike  Procedure(s) Performed: Procedure(s): DILATATION AND EVACUATION (N/A)  Patient Location: PACU  Anesthesia Type:Spinal  Level of Consciousness: awake, alert  and oriented  Airway & Oxygen Therapy: Patient Spontanous Breathing  Post-op Assessment: Report given to RN and Post -op Vital signs reviewed and stable  Post vital signs: Reviewed and stable  Last Vitals:  Vitals:   06/04/16 1431 06/04/16 1631  BP:  (P) 112/61  Pulse:  (P) 82  Resp:  (P) 11  Temp: 36.7 C (P) 36.8 C    Last Pain:  Vitals:   06/04/16 1431  TempSrc: Oral  PainSc: 4          Complications: No apparent anesthesia complications

## 2016-06-04 NOTE — Anesthesia Postprocedure Evaluation (Signed)
Anesthesia Post Note  Patient: Denise Terry  Procedure(s) Performed: Procedure(s) (LRB): DILATATION AND EVACUATION (N/A)  Patient location during evaluation: PACU Anesthesia Type: Spinal Level of consciousness: awake Pain management: satisfactory to patient Vital Signs Assessment: post-procedure vital signs reviewed and stable Respiratory status: spontaneous breathing Cardiovascular status: blood pressure returned to baseline Postop Assessment: no headache and spinal receding Anesthetic complications: no     Last Vitals:  Vitals:   06/04/16 1730 06/04/16 1745  BP: 109/65 107/61  Pulse: 90 87  Resp: 18 12  Temp: 36.9 C     Last Pain:  Vitals:   06/04/16 1431  TempSrc: Oral  PainSc: 4    Pain Goal:                 Jiles GarterJACKSON,Lindey Renzulli EDWARD

## 2016-06-04 NOTE — MAU Provider Note (Signed)
Chief Complaint:  Abdominal Pain   First Provider Initiated Contact with Patient 06/04/16 1200      HPI: Denise Terry is a 25 y.o. G2P1011 who presents to maternity admissions reporting pelvic and generalized abdominal pain for a week.  Also has had watery diarrhea for 2 days.. She reports vaginal bleeding, vaginal itching/burning, urinary symptoms, h/a, dizziness, n/v, or fever/chills.    History is remarkable for a surgical elective abortion a week ago.  Had some mild cramping the first two days then pain got worse.  Bleeding has been light.  Also developed watery diarrhea 2 days ago. None since being here.   Has stomach cramping throughout.  Abdominal Pain  This is a new problem. The current episode started in the past 7 days. The onset quality is gradual. The problem occurs constantly. The problem has been gradually worsening. The pain is located in the LLQ, RLQ, periumbilical region and generalized abdominal region. The pain is moderate. The quality of the pain is cramping and sharp. The abdominal pain does not radiate. Associated symptoms include diarrhea (for two days). Pertinent negatives include no constipation, dysuria, fever, myalgias, nausea or vomiting. The pain is aggravated by palpation and movement. The pain is relieved by nothing. She has tried nothing for the symptoms. Surgical abortion 1 week ago   RN Note; Had an abortion on 9/23 at [facility].  Had some mild pain since. Since Thursday she had severe pain, has gotten worse. Lower abdomen and up her sides. Bleeding has been lighter  Past Medical History: Past Medical History:  Diagnosis Date  . Anemia   . Medical history non-contributory     Past obstetric history: OB History  Gravida Para Term Preterm AB Living  2 1 1   1 1   SAB TAB Ectopic Multiple Live Births    1   0 1    # Outcome Date GA Lbr Len/2nd Weight Sex Delivery Anes PTL Lv  2 TAB 05/28/16          1 Term 07/18/14 [redacted]w[redacted]d 05:12 / 00:15 7 lb  2.6 oz (3.25 kg) F Vag-Spont EPI  LIV      Past Surgical History: Past Surgical History:  Procedure Laterality Date  . NO PAST SURGERIES    . THERAPEUTIC ABORTION      Family History: Family History  Problem Relation Age of Onset  . Diabetes Father   . Diabetes Maternal Grandmother   . Hypertension Maternal Grandmother   . Diabetes Maternal Grandfather   . Hypertension Maternal Grandfather   . Cancer Paternal Grandmother     breast  . Hearing loss Neg Hx     Social History: Social History  Substance Use Topics  . Smoking status: Never Smoker  . Smokeless tobacco: Never Used  . Alcohol use No    Allergies:  Allergies  Allergen Reactions  . Penicillins Hives and Rash    Meds:  Prescriptions Prior to Admission  Medication Sig Dispense Refill Last Dose  . ibuprofen (ADVIL,MOTRIN) 100 MG/5ML suspension Take 30 mLs (600 mg total) by mouth every 6 (six) hours. 273 mL 0   . Prenatal Vit-Fe Fumarate-FA (PRENATAL MULTIVITAMIN) TABS tablet Take 1 tablet by mouth daily at 12 noon.   07/18/2014 at Unknown time    I have reviewed patient's Past Medical Hx, Surgical Hx, Family Hx, Social Hx, medications and allergies.  ROS:  Review of Systems  Constitutional: Negative for chills and fever.  Respiratory: Negative for shortness of breath.  Gastrointestinal: Positive for abdominal pain and diarrhea (for two days). Negative for constipation, nausea and vomiting.  Genitourinary: Positive for pelvic pain and vaginal bleeding. Negative for difficulty urinating, dysuria and flank pain.  Musculoskeletal: Negative for back pain and myalgias.  Neurological: Negative for dizziness.   Other systems negative     Physical Exam  Patient Vitals for the past 24 hrs:  BP Temp Pulse Resp Height Weight  06/04/16 1250 (!) 82/54 - 91 - - -  06/04/16 1151 (!) 87/74 97.8 F (36.6 C) 99 16 - -  06/04/16 1146 - - - - 5\' 9"  (1.753 m) 136 lb 12.8 oz (62.1 kg)   Constitutional:  Well-developed, well-nourished female in no acute distress, but uncomfortable looking  Cardiovascular: normal rate and rhythm, though tachycardia at times., no ectopy audible, S1 & S2 heard, no murmur Respiratory: normal effort, no distress. Lungs CTAB with no wheezes or crackles GI: Abd soft, non-tender.  Nondistended.  No rebound, No guarding.  Bowel Sounds audible  MS: Extremities nontender, no edema, normal ROM Neurologic: Alert and oriented x 4.   Grossly nonfocal. GU: Neg CVAT. Skin:  Warm and Dry Psych:  Affect appropriate.  PELVIC EXAM: Cervix firm, anterior, neg CMT, uterus enlarged and tender, adnexa with tenderness (difficult to examine due to pain)    Labs: Results for orders placed or performed during the hospital encounter of 06/04/16 (from the past 24 hour(s))  Urinalysis, Routine w reflex microscopic ([facility])     Status: Abnormal   Collection Time: 06/04/16 11:45 AM  Result Value Ref Range   Color, Urine YELLOW YELLOW   APPearance CLEAR CLEAR   Specific Gravity, Urine >1.030 (H) 1.005 - 1.030   pH 6.0 5.0 - 8.0   Glucose, UA NEGATIVE NEGATIVE mg/dL   Hgb urine dipstick MODERATE (A) NEGATIVE   Bilirubin Urine SMALL (A) NEGATIVE   Ketones, ur 15 (A) NEGATIVE mg/dL   Protein, ur 161100 (A) NEGATIVE mg/dL   Nitrite NEGATIVE NEGATIVE   Leukocytes, UA NEGATIVE NEGATIVE  Urine microscopic-add on     Status: Abnormal   Collection Time: 06/04/16 11:45 AM  Result Value Ref Range   Squamous Epithelial / LPF 0-5 (A) NONE SEEN   WBC, UA 0-5 0 - 5 WBC/hpf   RBC / HPF 0-5 0 - 5 RBC/hpf   Bacteria, UA FEW (A) NONE SEEN  Pregnancy, urine POC     Status: Abnormal   Collection Time: 06/04/16 11:55 AM  Result Value Ref Range   Preg Test, Ur POSITIVE (A) NEGATIVE  CBC with Differential/Platelet     Status: Abnormal   Collection Time: 06/04/16 12:19 PM  Result Value Ref Range   WBC 3.3 (L) 4.0 - 10.5 K/uL   RBC 3.55 (L) 3.87 - 5.11 MIL/uL   Hemoglobin 10.2 (L) 12.0 - 15.0  g/dL   HCT 09.630.3 (L) 04.536.0 - 40.946.0 %   MCV 85.4 78.0 - 100.0 fL   MCH 28.7 26.0 - 34.0 pg   MCHC 33.7 30.0 - 36.0 g/dL   RDW 81.114.3 91.411.5 - 78.215.5 %   Platelets 205 150 - 400 K/uL   Neutrophils Relative % 45 %   Neutro Abs 1.5 (L) 1.7 - 7.7 K/uL   Lymphocytes Relative 44 %   Lymphs Abs 1.5 0.7 - 4.0 K/uL   Monocytes Relative 10 %   Monocytes Absolute 0.3 0.1 - 1.0 K/uL   Eosinophils Relative 0 %   Eosinophils Absolute 0.0 0.0 - 0.7 K/uL  Basophils Relative 1 %   Basophils Absolute 0.0 0.0 - 0.1 K/uL  Basic metabolic panel     Status: Abnormal   Collection Time: 06/04/16 12:19 PM  Result Value Ref Range   Sodium 136 135 - 145 mmol/L   Potassium 3.1 (L) 3.5 - 5.1 mmol/L   Chloride 99 (L) 101 - 111 mmol/L   CO2 29 22 - 32 mmol/L   Glucose, Bld 116 (H) 65 - 99 mg/dL   BUN 9 6 - 20 mg/dL   Creatinine, Ser 4.09 0.44 - 1.00 mg/dL   Calcium 8.5 (L) 8.9 - 10.3 mg/dL   GFR calc non Af Amer >60 >60 mL/min   GFR calc Af Amer >60 >60 mL/min   Anion gap 8 5 - 15      Imaging:  US Pelvis Complete  Result Date: 06/04/2016 CLINICAL DATA:  Uterine tenderness.  One week status post abortion. EXAM: TRANSABDOMINAL ULTRASOUND OF PELVIS TECHNIQUE: Transabdominal ultrasound examination of the pelvis was performed including evaluation of the uterus, ovaries, adnexal regions, and pelvic cul-de-sac. COMPARISON:  None. FINDINGS: Uterus Measurements: 10.7 x 7.1 x 0.3 cm. No fibroids or other mass visualized. Endometrium Thickness: 18.2 mm. Thickened and heterogeneous with increased peripheral blood flow. Right ovary Measurements: 4.6 x 2.2 x 2.7 cm. Normal appearance/no adnexal mass. Left ovary Measurements: 2.9 x 1.5 x 1.6 cm. Normal appearance/no adnexal mass. Other findings:  No abnormal free fluid. IMPRESSION: 1. Thickened heterogeneous endometrium with increased blood flow. Retained products of conception is possible. Given the increased blood flow and significant tenderness, endometritis should be considered.  2. No other abnormalities. Electronically Signed   By: Amie Portland M.D.   On: 06/04/2016 13:42    MAU Course/MDM: I have ordered labs as follows:  See above.  Lactate added due to her meeting two sepsis criteria Imaging ordered: pelvic US Results reviewed.   Consult Dr Sallye Ober with presentation, history and exam findings..   Treatments in MAU included IV fluids, .Toradol for pain.    Assessment: 1. Post-abortion complication     Plan: Plan per Dr Sallye Ober  Wynelle Bourgeois CNM, MSN Certified Nurse-Midwife 06/04/2016 1:09 PM

## 2016-06-04 NOTE — Op Note (Addendum)
Patient's: Denise Terry, Denise Terry MRN: [id number] Date of birth: Jan 14, 1991  Date of procedure: 06/04/2016  Preop diagnosis:   1. Retained products of conception. 2. Post-abortion endometritis 3. Concern for sepsis   Postop diagnosis: Same as above.  Procedure: Suction dilation and Curettage with Ultrasound guidance.   Surgeon: Dr. Hoover BrownsEMA Emalina Dubreuil.  Assistants: Field seismologistcrub technician, Selena BattenKim.  Anesthesia: Spinal- Dr. Jean RosenthalJackson  Complications: None  Input: 400cc LR  Output: EBL: 30cc.  Urine: 50cc.   Findings: 10 week size anteverted uterus. No palpable adnexal masses bilaterally. Cervix closed with little blood coming through the os.  Indications: 25 year-old G2 para 1011 who [facility] [[facility]acted] weeks gestation) who presented with severe abdominal pain, chills and night sweats.  Pelvic ultrasound showed possible retained products of conception versus endometritis.  She had low BPs,  pulse in the 90s and WBC of 3.3 and there was concern for sepsis.  A suction dilation and curettage was offered to clear the uterus of remaining products of conception.  She was a known Blood group O pos and HIV negative as of 05/20/16.      Procedure:  We discussed risks benefits and alternatives of the procedure including but not limited to heavy bleeding, infection, damage to organs, uterine scarring and Asherman's syndrome and informed consent was obtained from the patient.  She was taken to the operating room where anesthesia was administered without difficulty. Cefotetan and Doxycycline 100 mg IV were given preoperatively and to be continued after the procedure.  She was placed in the dorsal lithotomy position and prepped and draped in the usual sterile fashion. Straight catheterization was performed.  A speculum was used to view the cervix. The ring forcep was used to hold the cervix in place.  The cervix was dilated serially with the [facility] dilators  up to #23 dilator. The suction curette #8 curved was then advanced and the uterus cleared of the products of conception with 3 passes of the suction curette under abdominal ultrasound guidance.  Gentle sharp curettage was then performed and the 4 quadrants were noted to be gritty.  The suction curette was once again passed into the uterus to clear it of the remaining products of conception. There was minimal bleeding at the end of this procedure. The instruments were then removed and the patient was then awoken from anesthesia she was cleaned and taken to recovery room in stable condition  Specimen: Products of conception.  Dr. Sallye OberKulwa

## 2016-06-04 NOTE — MAU Note (Signed)
[facility]  Had some mild pain since. Since Thursday she had severe pain, has gotten worse. Lower abdomen and up her sides. Bleeding has been lighter.

## 2016-06-04 NOTE — Anesthesia Procedure Notes (Signed)

## 2016-06-05 ENCOUNTER — Encounter (HOSPITAL_COMMUNITY): Payer: Self-pay | Admitting: *Deleted

## 2016-06-05 LAB — CBC
HEMATOCRIT: 25.3 % — AB (ref 36.0–46.0)
HEMOGLOBIN: 8.5 g/dL — AB (ref 12.0–15.0)
MCH: 29.1 pg (ref 26.0–34.0)
MCHC: 33.6 g/dL (ref 30.0–36.0)
MCV: 86.6 fL (ref 78.0–100.0)
Platelets: 173 10*3/uL (ref 150–400)
RBC: 2.92 MIL/uL — ABNORMAL LOW (ref 3.87–5.11)
RDW: 14.5 % (ref 11.5–15.5)
WBC: 3.7 10*3/uL — ABNORMAL LOW (ref 4.0–10.5)

## 2016-06-05 MED ORDER — DOXYCYCLINE HYCLATE 50 MG PO CAPS: 100.0000 mg | ORAL_CAPSULE | Freq: Two times a day (BID) | ORAL | 0 refills | Status: AC

## 2016-06-05 MED ORDER — OXYCODONE-ACETAMINOPHEN 5-325 MG PO TABS: 1.0000 | ORAL_TABLET | ORAL | 0 refills | Status: DC | PRN

## 2016-06-05 MED ORDER — IBUPROFEN 600 MG PO TABS: 600.0000 mg | ORAL_TABLET | Freq: Four times a day (QID) | ORAL | 0 refills | Status: DC | PRN

## 2016-06-05 NOTE — Addendum Note (Signed)
Addendum  created 06/05/16 [id number] by Algis GreenhouseLinda A Maimouna Rondeau, CRNA   Sign clinical note

## 2016-06-05 NOTE — Anesthesia Postprocedure Evaluation (Signed)
Anesthesia Post Note  Patient: Denise Terry  Procedure(s) Performed: Procedure(s) (LRB): DILATATION AND EVACUATION (N/A)  Patient location during evaluation: [facility] Anesthesia Type: Spinal Level of consciousness: awake Pain management: satisfactory to patient Vital Signs Assessment: post-procedure vital signs reviewed and stable Respiratory status: spontaneous breathing Cardiovascular status: stable Anesthetic complications: no     Last Vitals:  Vitals:   06/05/16 0138 06/05/16 0522  BP: (!) 91/45 (!) 94/50  Pulse: 84 85  Resp: 14 18  Temp: 36.9 C 37.2 C    Last Pain:  Vitals:   06/05/16 0526  TempSrc:   PainSc: 0-No pain   Pain Goal:                 KeyCorpBURGER,Dillyn Joaquin

## 2016-06-05 NOTE — Discharge Summary (Signed)
Physician Discharge Summary  Patient ID: Corey Haroldrica G Rote MRN: [id number] DOB/AGE: 10-Mar-1991 25 y.o.  Admit date: 06/04/2016 Discharge date: 06/05/2016  Admission Diagnoses:  Discharge Diagnoses:  Active Problems:   Retained products of conception following abortion   Discharged Condition: good  Hospital Course: Pt came in with pain and retained products after EAB.  She is afebrile and stable and will [facility] nex[facility]on [facility].    Consults: None  Significant Diagnostic Studies: US  Treatments: IV hydration, antibiotics: ceftriaxone and doxycycline and surgery: D&E  Discharge Exam: Blood pressure (!) 94/47, pulse 90, temperature 97.9 F (36.6 C), resp. rate 16, height 5\' 9"  (1.753 m), weight 136 lb 12.8 oz (62.1 kg), last menstrual period 03/30/2016, SpO2 100 %, unknown if currently breastfeeding. General appearance: alert and cooperative Resp: clear to auscultation bilaterally GI: soft, non-tender; bowel sounds normal; no masses,  no organomegaly and mild tenderess Extremities: Homans sign is negative, no sign of DVT  Disposition: 01-Home or Self Care    Follow-up Information    Jessee AversOLE,TARA J., MD .   Specialty:  Obstetrics and Gynecology Why:  keep your scheduled appointment Contact information: [address] [place] [address] [phone]           Signed: Jaymes GraffDILLARD,Yailin Biederman A 06/05/2016, 12:07 PM

## 2016-06-06 ENCOUNTER — Encounter (HOSPITAL_COMMUNITY): Payer: Self-pay | Admitting: Obstetrics & Gynecology

## 2016-06-09 LAB — CULTURE, BLOOD (ROUTINE X 2): CULTURE: NO GROWTH

## 2017-01-19 ENCOUNTER — Encounter (HOSPITAL_COMMUNITY): Payer: Self-pay | Admitting: Emergency Medicine

## 2017-01-19 ENCOUNTER — Ambulatory Visit (HOSPITAL_COMMUNITY)
Admission: EM | Admit: 2017-01-19 | Discharge: 2017-01-19 | Disposition: A | Discharge status: Home / Self Care | Payer: BLUE CROSS/BLUE SHIELD | Attending: Internal Medicine | Admitting: Internal Medicine

## 2017-01-19 DIAGNOSIS — H1013 Acute atopic conjunctivitis, bilateral: Secondary | ICD-10-CM | POA: Diagnosis not present

## 2017-01-19 DIAGNOSIS — R0982 Postnasal drip: Secondary | ICD-10-CM

## 2017-01-19 NOTE — ED Triage Notes (Signed)
Throat has been tender for a few weeks.  Has noticed a white patch on tonsil, intermittently.  Today right eye is red, painful.  otc eye drops are not helping

## 2017-01-19 NOTE — Discharge Instructions (Signed)
For drainage in the back of your throat it may be coming and going take Allegra or Zyrtec daily. Use Zaditor eyedrops 1 drop in each eye twice a day as needed for allergies of the eyes. He may need to ask pharmacist where to get it. Her throat looks essentially normal today no foreign bodies or infection is seen.

## 2017-01-19 NOTE — ED Provider Notes (Signed)
CSN: [id number]     Arrival date & time 01/19/17  1754 History   First MD Initiated Contact with Patient 01/19/17 1923     Chief Complaint  Patient presents with  . Eye Problem   (Consider location/radiation/quality/duration/timing/severity/associated sxs/prior Treatment) Healthy-appearing 26 year old female states on May 4 she was in a warehouse and when she inhales she felt like auricle dust got in her throat. Since then she has been concentrating on her throat being dry at some point having soreness on one side and the other is seen a white spot on the left-sided in the right side than the left side. States she is constantly looking at her throat. She also has itchy eyes and some redness to the right eye. No cough or shortness of breath.      Past Medical History:  Diagnosis Date  . Anemia   . Medical history non-contributory    Past Surgical History:  Procedure Laterality Date  . DILATION AND EVACUATION N/A 06/04/2016   Procedure: DILATATION AND EVACUATION;  Surgeon: Hoover BrownsEma Kulwa, MD;  Location: [facility];  Service: Gynecology;  Laterality: N/A;  . NO PAST SURGERIES    . THERAPEUTIC ABORTION     Family History  Problem Relation Age of Onset  . Diabetes Father   . Diabetes Maternal Grandmother   . Hypertension Maternal Grandmother   . Diabetes Maternal Grandfather   . Hypertension Maternal Grandfather   . Cancer Paternal Grandmother        breast  . Hearing loss Neg Hx    Social History  Substance Use Topics  . Smoking status: Never Smoker  . Smokeless tobacco: Never Used  . Alcohol use No   OB History    Gravida Para Term Preterm AB Living   2 1 1   1 1    SAB TAB Ectopic Multiple Live Births     1   0 1     Review of Systems  Constitutional: Negative.   HENT: Positive for sore throat. Negative for congestion, facial swelling, rhinorrhea and trouble swallowing.   Eyes: Positive for redness and itching.  Respiratory: Negative.   Cardiovascular: Negative.    Gastrointestinal: Negative.   Neurological: Negative.   All other systems reviewed and are negative.   Allergies  Penicillins  Home Medications   Prior to Admission medications   Medication Sig Start Date End Date Taking? Authorizing Provider  OVER THE COUNTER MEDICATION Birth control pill   Yes [provider]  ibuprofen (ADVIL,MOTRIN) 600 MG tablet Take 1 tablet (600 mg total) by mouth every 6 (six) hours as needed (mild pain). 06/05/16   Jaymes Graffillard, Naima, MD  oxyCODONE-acetaminophen (PERCOCET/ROXICET) 5-325 MG tablet Take 1-2 tablets by mouth every 4 (four) hours as needed (moderate to severe pain (when tolerating fluids)). 06/05/16   Jaymes Graffillard, Naima, MD   Meds Ordered and Administered this Visit  Medications - No data to display  BP 118/75 (BP Location: Right Arm)   Pulse 93   Temp 98.4 F (36.9 C) (Oral)   Resp 14   SpO2 100%  No data found.   Physical Exam  Constitutional: She is oriented to person, place, and time. She appears well-developed and well-nourished. No distress.  HENT:  Bilateral TMs are normal. Oropharynx clear and normal. White patch she is speaking of appears to be normal to me or potentially a food particle is been partially digested. It is just slightly larger than the head of the hand. It is located to the posterior left  palatine tonsil which is quite small. There is no swelling of the oropharynx. Minor cobblestoning. No signs of infection or foreign body. Airways widely patent.  Eyes: EOM are normal. Pupils are equal, round, and reactive to light. Right eye exhibits no discharge. Left eye exhibits no discharge.  Minimal erythema to the right lower eyelid. Lesser erythema to the left lower eyelid. No current watery or other drainage. Sclera clear.  Neck: Normal range of motion. Neck supple.  Pulmonary/Chest: Effort normal and breath sounds normal. No respiratory distress. She has no wheezes. She has no rales.  Musculoskeletal: She exhibits no edema.   Lymphadenopathy:    She has no cervical adenopathy.  Neurological: She is alert and oriented to person, place, and time.  Skin: Skin is warm and dry.  Psychiatric: She has a normal mood and affect.  Nursing note and vitals reviewed.   Urgent Care Course     Procedures (including critical care time)  Labs Review Labs Reviewed - No data to display  Imaging Review No results found.   Visual Acuity Review  Right Eye Distance:   Left Eye Distance:   Bilateral Distance:    Right Eye Near:   Left Eye Near:    Bilateral Near:         MDM   1. Allergic conjunctivitis of both eyes   2. PND (post-nasal drip)    For drainage in the back of your throat it may be coming and going take Allegra or Zyrtec daily. Use Zaditor eyedrops 1 drop in each eye twice a day as needed for allergies of the eyes. He may need to ask pharmacist where to get it. Her throat looks essentially normal today no foreign bodies or infection is seen.     Hayden Rasmussen, NP 01/19/17 1942

## 2018-09-05 DIAGNOSIS — O009 Unspecified ectopic pregnancy without intrauterine pregnancy: Secondary | ICD-10-CM

## 2018-09-05 HISTORY — DX: Unspecified ectopic pregnancy without intrauterine pregnancy: O00.90

## 2018-12-15 ENCOUNTER — Encounter (HOSPITAL_COMMUNITY): Payer: Self-pay

## 2018-12-15 ENCOUNTER — Inpatient Hospital Stay (HOSPITAL_COMMUNITY): Payer: BLUE CROSS/BLUE SHIELD

## 2018-12-15 ENCOUNTER — Other Ambulatory Visit: Payer: Self-pay

## 2018-12-15 ENCOUNTER — Inpatient Hospital Stay (HOSPITAL_COMMUNITY)
Admission: AD | Admit: 2018-12-15 | Discharge: 2018-12-15 | Disposition: A | Discharge status: Home / Self Care | Payer: BLUE CROSS/BLUE SHIELD | Attending: Family Medicine | Admitting: Family Medicine

## 2018-12-15 DIAGNOSIS — O3680X0 Pregnancy with inconclusive fetal viability, not applicable or unspecified: Secondary | ICD-10-CM

## 2018-12-15 DIAGNOSIS — R109 Unspecified abdominal pain: Secondary | ICD-10-CM

## 2018-12-15 DIAGNOSIS — Z3A01 Less than 8 weeks gestation of pregnancy: Secondary | ICD-10-CM

## 2018-12-15 DIAGNOSIS — O26891 Other specified pregnancy related conditions, first trimester: Secondary | ICD-10-CM | POA: Diagnosis not present

## 2018-12-15 LAB — URINALYSIS, ROUTINE W REFLEX MICROSCOPIC
Bilirubin Urine: NEGATIVE
Glucose, UA: NEGATIVE mg/dL
Hgb urine dipstick: NEGATIVE
Ketones, ur: 5 mg/dL — AB
Leukocytes,Ua: NEGATIVE
Nitrite: NEGATIVE
Protein, ur: NEGATIVE mg/dL
Specific Gravity, Urine: 1.023 (ref 1.005–1.030)
pH: 7 (ref 5.0–8.0)

## 2018-12-15 LAB — CBC
HCT: 34.2 % — ABNORMAL LOW (ref 36.0–46.0)
Hemoglobin: 11.2 g/dL — ABNORMAL LOW (ref 12.0–15.0)
MCH: 28.6 pg (ref 26.0–34.0)
MCHC: 32.7 g/dL (ref 30.0–36.0)
MCV: 87.5 fL (ref 80.0–100.0)
Platelets: 225 10*3/uL (ref 150–400)
RBC: 3.91 MIL/uL (ref 3.87–5.11)
RDW: 14 % (ref 11.5–15.5)
WBC: 6.3 10*3/uL (ref 4.0–10.5)
nRBC: 0 % (ref 0.0–0.2)

## 2018-12-15 LAB — POCT PREGNANCY, URINE: Preg Test, Ur: POSITIVE — AB

## 2018-12-15 LAB — WET PREP, GENITAL
Sperm: NONE SEEN
Trich, Wet Prep: NONE SEEN
Yeast Wet Prep HPF POC: NONE SEEN

## 2018-12-15 LAB — HCG, QUANTITATIVE, PREGNANCY: hCG, Beta Chain, Quant, S: 561 m[IU]/mL — ABNORMAL HIGH (ref <5)

## 2018-12-15 NOTE — Discharge Instructions (Signed)
Return to care   If you have heavier bleeding that soaks through more that 2 pads per hour for an hour or more  If you bleed so much that you feel like you might pass out or you do pass out  If you have significant abdominal pain that is not improved with Tylenol        Warning Signs During Pregnancy A pregnancy lasts about 40 weeks, starting from the first day of your last period until the baby is born. Pregnancy is divided into three phases called trimesters.  The first trimester refers to week 1 through week 13 of pregnancy.  The second trimester is the start of week 14 through the end of week 27.  The third trimester is the start of week 28 until you deliver your baby. During each trimester of pregnancy, certain signs and symptoms may indicate a problem. Talk with your health care provider about your current health and any medical conditions you have. Make sure you know the symptoms that you should watch for and report. How does this affect me?  Warning signs in the first trimester While some changes during the first trimester may be uncomfortable, most do not represent a serious problem. Let your health care provider know if you have any of the following warning signs in the first trimester:  You cannot eat or drink without vomiting, and this lasts for longer than a day.  You have vaginal bleeding or spotting along with menstrual-like cramping.  You have diarrhea for longer than a day.  You have a fever or other signs of infection, such as: ? Pain or burning when you urinate. ? Foul smelling or thick or yellowish vaginal discharge. Warning signs in the second trimester As your baby grows and changes during the second trimester, there are additional signs and symptoms that may indicate a problem. These include:  Signs and symptoms of infection, including a fever.  Signs or symptoms of a miscarriage or preterm labor, such as regular contractions, menstrual-like cramping,  or lower abdominal pain.  Bloody or watery vaginal discharge or obvious vaginal bleeding.  Feeling like your heart is pounding.  Having trouble breathing.  Nausea, vomiting, or diarrhea that lasts for longer than a day.  Craving non-food items, such as clay, chalk, or dirt. This may be a sign of a very treatable medical condition called pica. Later in your second trimester, watch for signs and symptoms of a serious medical condition called preeclampsia.These include:  Changes in your vision.  A severe headache that does not go away.  Nausea and vomiting. It is also important to notice if your baby stops moving or moves less than usual during this time. Warning signs in the third trimester As you approach the third trimester, your baby is growing and your body is preparing for the birth of your baby. In your third trimester, be sure to let your health care provider know if:  You have signs and symptoms of infection, including a fever.  You have vaginal bleeding.  You notice that your baby is moving less than usual or is not moving.  You have nausea, vomiting, or diarrhea that lasts for longer than a day.  You have a severe headache that does not go away.  You have vision changes, including seeing spots or having blurry or double vision.  You have increased swelling in your hands or face. How does this affect my baby? Throughout your pregnancy, always report any of the warning signs  of a problem to your health care provider. This can help prevent complications that may affect your baby, including:  Increased risk for premature birth.  Infection that may be transmitted to your baby.  Increased risk for stillbirth. Contact a health care provider if:  You have any of the warning signs of a problem for the current trimester of your pregnancy.  Any of the following apply to you during any trimester of pregnancy: ? You have strong emotions, such as sadness or anxiety, that  interfere with work or personal relationships. ? You feel unsafe in your home and need help finding a safe place to live. ? You are using tobacco products, alcohol, or drugs and you need help to stop. Get help right away if: You have signs or symptoms of labor before 37 weeks of pregnancy. These include:  Contractions that are 5 minutes or less apart, or that increase in frequency, intensity, or length.  Sudden, sharp abdominal pain or low back pain.  Uncontrolled gush or trickle of fluid from your vagina. Summary  A pregnancy lasts about 40 weeks, starting from the first day of your last period until the baby is born. Pregnancy is divided into three phases called trimesters. Each trimester has warning signs to watch for.  Always report any warning signs to your health care provider in order to prevent complications that may affect both you and your baby.  Talk with your health care provider about your current health and any medical conditions you have. Make sure you know the symptoms that you should watch for and report. This information is not intended to replace advice given to you by your health care provider. Make sure you discuss any questions you have with your health care provider. Document Released: 06/08/2017 Document Revised: 06/08/2017 Document Reviewed: 06/08/2017 Elsevier Interactive Patient Education  2019 [employer]

## 2018-12-15 NOTE — MAU Note (Signed)
Denise Terry is a 28 y.o. at Unknown here in MAU reporting: started cramping 2 days ago and had some sharp pains last night. No bleeding  LMP: 11/10/18  Onset of complaint: 2 days ago  Pain score: 6/10  Vitals:   12/15/18 1446  BP: 113/62  Pulse: 99  Resp: 18  Temp: 97.8 F (36.6 C)  SpO2: 99%      Lab orders placed from triage: UPT

## 2018-12-15 NOTE — MAU Provider Note (Signed)
Chief Complaint: Abdominal Pain   First Provider Initiated Contact with Patient 12/15/18 1510     SUBJECTIVE HPI: Denise Terry is a 28 y.o. G3P1011 at [redacted]w[redacted]d who presents to Maternity Admissions reporting abdominal pain. Symptoms started a few days ago. Pain is intermittent throughout low back & abdomen. Denies n/v/d, constipation, dysuria, vaginal bleeding, or vaginal discharge.  Plans on going to [facility] for prenatal care. Went there yesterday to give intake information. F/u appointment not yet scheduled.   Location: abdomen & back Quality: cramping, sharp Severity: 6/10 on pain scale Duration: 2 days Timing: intermittent Modifying factors: none Associated signs and symptoms: none  Past Medical History:  Diagnosis Date  . Anemia    OB History  Gravida Para Term Preterm AB Living  SAB TAB Ectopic Multiple Live Births    1   0 1    # Outcome Date GA Lbr Len/2nd Weight Sex Delivery Anes PTL Lv  3 Current           2 TAB 05/28/16          1 Term 07/18/14 [redacted]w[redacted]d 05:12 / 00:15 3250 g F Vag-Spont EPI  LIV   Past Surgical History:  Procedure Laterality Date  . DILATION AND EVACUATION N/A 06/04/2016   Procedure: DILATATION AND EVACUATION;  Surgeon: Hoover Browns, MD;  Location: [facility];  Service: Gynecology;  Laterality: N/A;  . THERAPEUTIC ABORTION     Social History   Socioeconomic History  . Marital status: Single    Spouse name: Not on file  . Number of children: Not on file  . Years of education: Not on file  . Highest education level: Not on file  Occupational History  . Not on file  Social Needs  . Financial resource strain: Not on file  . Food insecurity:    Worry: Not on file    Inability: Not on file  . Transportation needs:    Medical: Not on file    Non-medical: Not on file  Tobacco Use  . Smoking status: Never Smoker  . Smokeless tobacco: Never Used  Substance and Sexual Activity  . Alcohol use: No  . Drug use: No  . Sexual  activity: Yes  Lifestyle  . Physical activity:    Days per week: Not on file    Minutes per session: Not on file  . Stress: Not on file  Relationships  . Social connections:    Talks on phone: Not on file    Gets together: Not on file    Attends religious service: Not on file    Active member of club or organization: Not on file    Attends meetings of clubs or organizations: Not on file    Relationship status: Not on file  . Intimate partner violence:    Fear of current or ex partner: Not on file    Emotionally abused: Not on file    Physically abused: Not on file    Forced sexual activity: Not on file  Other Topics Concern  . Not on file  Social History Narrative  . Not on file   Family History  Problem Relation Age of Onset  . Diabetes Father   . Diabetes Maternal Grandmother   . Hypertension Maternal Grandmother   . Diabetes Maternal Grandfather   . Hypertension Maternal Grandfather   . Cancer Paternal Grandmother        breast  . Hearing loss Neg Hx  No current facility-administered medications on file prior to encounter.    No current outpatient medications on file prior to encounter.   Allergies  Allergen Reactions  . Penicillins Hives and Rash    Has patient had a PCN reaction causing immediate rash, facial/tongue/throat swelling, SOB or lightheadedness with hypotension: NO Has patient had a PCN reaction causing severe rash involving mucus membranes or skin necrosis: No Has patient had a PCN reaction that required hospitalization No Has patient had a PCN reaction occurring within the last 10 years: No If all of the above answers are "NO", then may proceed with Cephalosporin use.     I have reviewed patient's Past Medical Hx, Surgical Hx, Family Hx, Social Hx, medications and allergies.   Review of Systems  Constitutional: Negative.   Gastrointestinal: Positive for abdominal pain. Negative for constipation, diarrhea, nausea and vomiting.  Genitourinary:  Negative.     OBJECTIVE Patient Vitals for the past 24 hrs:  BP Temp Temp src Pulse Resp SpO2  12/15/18 1720 117/63 - - 86 - -  12/15/18 1509 114/69 - - 95 - -  12/15/18 1446 113/62 97.8 F (36.6 C) Oral 99 18 99 %   Constitutional: Well-developed, well-nourished female in no acute distress.  Cardiovascular: normal rate & rhythm, no murmur Respiratory: normal rate and effort. Lung sounds clear throughout GI: Abd soft, non-tender, Pos BS x 4. No guarding or rebound tenderness MS: Extremities nontender, no edema, normal ROM Neurologic: Alert and oriented x 4.   LAB RESULTS Results for orders placed or performed during the hospital encounter of 12/15/18 (from the past 24 hour(s))  Urinalysis, Routine w reflex microscopic     Status: Abnormal   Collection Time: 12/15/18  2:41 PM  Result Value Ref Range   Color, Urine YELLOW YELLOW   APPearance HAZY (A) CLEAR   Specific Gravity, Urine 1.023 1.005 - 1.030   pH 7.0 5.0 - 8.0   Glucose, UA NEGATIVE NEGATIVE mg/dL   Hgb urine dipstick NEGATIVE NEGATIVE   Bilirubin Urine NEGATIVE NEGATIVE   Ketones, ur 5 (A) NEGATIVE mg/dL   Protein, ur NEGATIVE NEGATIVE mg/dL   Nitrite NEGATIVE NEGATIVE   Leukocytes,Ua NEGATIVE NEGATIVE  Pregnancy, urine POC     Status: Abnormal   Collection Time: 12/15/18  2:42 PM  Result Value Ref Range   Preg Test, Ur POSITIVE (A) NEGATIVE  Wet prep, genital     Status: Abnormal   Collection Time: 12/15/18  3:22 PM  Result Value Ref Range   Yeast Wet Prep HPF POC NONE SEEN NONE SEEN   Trich, Wet Prep NONE SEEN NONE SEEN   Clue Cells Wet Prep HPF POC PRESENT (A) NONE SEEN   WBC, Wet Prep HPF POC MODERATE (A) NONE SEEN   Sperm NONE SEEN   CBC     Status: Abnormal   Collection Time: 12/15/18  3:30 PM  Result Value Ref Range   WBC 6.3 4.0 - 10.5 K/uL   RBC 3.91 3.87 - 5.11 MIL/uL   Hemoglobin 11.2 (L) 12.0 - 15.0 g/dL   HCT 40.934.2 (L) 81.136.0 - 91.446.0 %   MCV 87.5 80.0 - 100.0 fL   MCH 28.6 26.0 - 34.0 pg   MCHC  32.7 30.0 - 36.0 g/dL   RDW 78.214.0 95.611.5 - 21.315.5 %   Platelets 225 150 - 400 K/uL   nRBC 0.0 0.0 - 0.2 %  hCG, quantitative, pregnancy     Status: Abnormal   Collection Time: 12/15/18  3:30 PM  Result Value Ref Range   hCG, Beta Chain, Quant, S 561 (H) <5 mIU/mL    IMAGING US Ob Less Than 14 Weeks With Ob Transvaginal  Result Date: 12/15/2018 CLINICAL DATA:  First trimester pregnancy. Abdominal pain. Serum beta HCG level 561. LMP 11/10/2018. EXAM: OBSTETRIC <14 WK [place] AND TRANSVAGINAL OB US TECHNIQUE: Both transabdominal and transvaginal ultrasound examinations were performed for complete evaluation of the gestation as well as the maternal uterus, adnexal regions, and pelvic cul-de-sac. Transvaginal technique was performed to assess early pregnancy. COMPARISON:  Pelvic ultrasound 06/04/2016. FINDINGS: Intrauterine gestational sac: Not identified. Yolk sac:  Absent. Embryo:  Absent. Subchorionic hemorrhage:  None visualized. Maternal uterus/adnexae: Small right ovarian corpus luteum/follicle, measuring 16 mm maximally. No suspicious adnexal findings or free pelvic fluid. IMPRESSION: No intrauterine gestational sac, yolk sac, fetal pole, or cardiac activity visualized. Differential considerations include intrauterine gestation too early to be sonographically visualized, spontaneous abortion, or ectopic pregnancy. Consider follow-up ultrasound in 14 days and serial quantitative beta HCG follow-up. Electronically Signed   By: Carey Bullocks M.D.   On: 12/15/2018 16:57    MAU COURSE Orders Placed This Encounter  Procedures  . Wet prep, genital  . US OB LESS THAN 14 WEEKS WITH OB TRANSVAGINAL  . Urinalysis, Routine w reflex microscopic  . CBC  . hCG, quantitative, pregnancy  . Pregnancy, urine POC  . Discharge patient   No orders of the defined types were placed in this encounter.   MDM +UPT UA, wet prep, GC/chlamydia, CBC, ABO/Rh, quant hCG, and [place] today to rule out ectopic  pregnancy  Ultrasound shows no IUP or concerning adnexal mass. HCG 561. Pt needs repeat HCG on Tuesday.  Patient plans on going to [facility] ob/gyn & has gone there for intake.  ASSESSMENT 1. Pregnancy of unknown anatomic location   2. Abdominal pain during pregnancy in first trimester     PLAN Discharge home in stable condition. SAB vs ectopic precautions Note routed to [facility] ob After hours number called for [place] ob/g[facility]; notified of patient's need for HCG level check on Tuesday.   Follow-up Information    [facility] Follow up.   Why:  Call office for follow up lab appointment on Tuesday Contact information: [address]  [address] [place] [address] [phone]          Allergies as of 12/15/2018      Reactions   Penicillins Hives, Rash   Has patient had a PCN reaction causing immediate rash, facial/tongue/throat swelling, SOB or lightheadedness with hypotension: NO Has patient had a PCN reaction causing severe rash involving mucus membranes or skin necrosis: No Has patient had a PCN reaction that required hospitalization No Has patient had a PCN reaction occurring within the last 10 years: No If all of the above answers are "NO", then may proceed with Cephalosporin use.      Medication List    STOP taking these medications   ibuprofen 600 MG tablet Commonly known as:  ADVIL,MOTRIN   OVER THE COUNTER MEDICATION   oxyCODONE-acetaminophen 5-325 MG tablet Commonly known as:  PERCOCET/ROXICET        Judeth Horn, NP 12/15/2018  5:46 PM

## 2018-12-17 LAB — URINE CYTOLOGY ANCILLARY ONLY
Chlamydia: NEGATIVE
Neisseria Gonorrhea: NEGATIVE

## 2018-12-17 LAB — GC/CHLAMYDIA PROBE AMP (~~LOC~~) NOT AT ARMC
Chlamydia: NEGATIVE
Neisseria Gonorrhea: NEGATIVE

## 2018-12-18 ENCOUNTER — Telehealth: Payer: Self-pay | Admitting: Family Medicine

## 2018-12-18 ENCOUNTER — Inpatient Hospital Stay (HOSPITAL_COMMUNITY)
Admission: AD | Admit: 2018-12-18 | Discharge: 2018-12-18 | Disposition: A | Discharge status: Home / Self Care | Payer: BLUE CROSS/BLUE SHIELD | Attending: Obstetrics and Gynecology | Admitting: Obstetrics and Gynecology

## 2018-12-18 ENCOUNTER — Other Ambulatory Visit: Payer: Self-pay

## 2018-12-18 ENCOUNTER — Telehealth (INDEPENDENT_AMBULATORY_CARE_PROVIDER_SITE_OTHER): Payer: BLUE CROSS/BLUE SHIELD

## 2018-12-18 DIAGNOSIS — Z3A01 Less than 8 weeks gestation of pregnancy: Secondary | ICD-10-CM | POA: Insufficient documentation

## 2018-12-18 DIAGNOSIS — O3680X0 Pregnancy with inconclusive fetal viability, not applicable or unspecified: Secondary | ICD-10-CM | POA: Insufficient documentation

## 2018-12-18 LAB — HCG, QUANTITATIVE, PREGNANCY: hCG, Beta Chain, Quant, S: 501 m[IU]/mL — ABNORMAL HIGH (ref <5)

## 2018-12-18 NOTE — Telephone Encounter (Addendum)
[id number] Corey Harold   Patient called and verified her identity via birth date and last 4 of her SSN.  Patient agreeable to results via phone and was informed of return hCG of 501, which was down from 561 on 12/15/2018. Informed of need for additional follow up labs.  Denies vaginal bleeding and pain that originally caused her to seek care  She reports that she has no pain or N/V. She questions if her levels will go back up and provider informed her that this is unlikely.  She was also informed of need for repeat quant in 48-72 hours and patient opts for outpatient lab visit on Thursday April 16th at 0830.  She was given address of new of [facility] clinic ([address]).  Patient questions what follow up would be if levels continued to drop.  Informed that would follow Q1-2 weeks until down to normal state.  Patient without further questions or concerns.  Bleeding precautions given.    Total time of call was 7 minutes.   Cherre Robins, CNM 10:24 AM 12/18/2018

## 2018-12-18 NOTE — Telephone Encounter (Signed)
Attempted to call patient to inform her of the new address and procedure change. No answer, left detailed message with this information.

## 2018-12-18 NOTE — MAU Note (Signed)
Here for f/u bloodwork.  Unable to reach ob/gyn.   Was confused where to go for f/u.  States pain has stopped. No bleeding. No complaints.

## 2018-12-20 ENCOUNTER — Inpatient Hospital Stay (HOSPITAL_COMMUNITY): Payer: BLUE CROSS/BLUE SHIELD

## 2018-12-20 ENCOUNTER — Inpatient Hospital Stay (HOSPITAL_COMMUNITY)
Admission: AD | Admit: 2018-12-20 | Discharge: 2018-12-20 | Disposition: A | Discharge status: Home / Self Care | Payer: BLUE CROSS/BLUE SHIELD | Attending: Obstetrics and Gynecology | Admitting: Obstetrics and Gynecology

## 2018-12-20 ENCOUNTER — Other Ambulatory Visit: Payer: BLUE CROSS/BLUE SHIELD

## 2018-12-20 ENCOUNTER — Other Ambulatory Visit: Payer: Self-pay

## 2018-12-20 ENCOUNTER — Encounter (HOSPITAL_COMMUNITY): Payer: Self-pay

## 2018-12-20 DIAGNOSIS — O9989 Other specified diseases and conditions complicating pregnancy, childbirth and the puerperium: Secondary | ICD-10-CM | POA: Diagnosis not present

## 2018-12-20 DIAGNOSIS — Z833 Family history of diabetes mellitus: Secondary | ICD-10-CM | POA: Diagnosis not present

## 2018-12-20 DIAGNOSIS — O3680X0 Pregnancy with inconclusive fetal viability, not applicable or unspecified: Secondary | ICD-10-CM | POA: Insufficient documentation

## 2018-12-20 DIAGNOSIS — D649 Anemia, unspecified: Secondary | ICD-10-CM | POA: Insufficient documentation

## 2018-12-20 DIAGNOSIS — Z3A01 Less than 8 weeks gestation of pregnancy: Secondary | ICD-10-CM | POA: Insufficient documentation

## 2018-12-20 DIAGNOSIS — Z880 Allergy status to penicillin: Secondary | ICD-10-CM | POA: Insufficient documentation

## 2018-12-20 DIAGNOSIS — Z803 Family history of malignant neoplasm of breast: Secondary | ICD-10-CM | POA: Insufficient documentation

## 2018-12-20 DIAGNOSIS — O99011 Anemia complicating pregnancy, first trimester: Secondary | ICD-10-CM | POA: Diagnosis not present

## 2018-12-20 DIAGNOSIS — O0281 Inappropriate change in quantitative human chorionic gonadotropin (hCG) in early pregnancy: Secondary | ICD-10-CM

## 2018-12-20 DIAGNOSIS — R109 Unspecified abdominal pain: Secondary | ICD-10-CM | POA: Insufficient documentation

## 2018-12-20 DIAGNOSIS — O26891 Other specified pregnancy related conditions, first trimester: Secondary | ICD-10-CM

## 2018-12-20 LAB — CBC WITH DIFFERENTIAL/PLATELET
Abs Immature Granulocytes: 0.02 10*3/uL (ref 0.00–0.07)
Basophils Absolute: 0.1 10*3/uL (ref 0.0–0.1)
Basophils Relative: 1 %
Eosinophils Absolute: 0.2 10*3/uL (ref 0.0–0.5)
Eosinophils Relative: 4 %
HCT: 36.8 % (ref 36.0–46.0)
Hemoglobin: 11.6 g/dL — ABNORMAL LOW (ref 12.0–15.0)
Immature Granulocytes: 0 %
Lymphocytes Relative: 43 %
Lymphs Abs: 2.8 10*3/uL (ref 0.7–4.0)
MCH: 27.8 pg (ref 26.0–34.0)
MCHC: 31.5 g/dL (ref 30.0–36.0)
MCV: 88 fL (ref 80.0–100.0)
Monocytes Absolute: 0.6 10*3/uL (ref 0.1–1.0)
Monocytes Relative: 9 %
Neutro Abs: 2.8 10*3/uL (ref 1.7–7.7)
Neutrophils Relative %: 43 %
Platelets: 227 10*3/uL (ref 150–400)
RBC: 4.18 MIL/uL (ref 3.87–5.11)
RDW: 13.8 % (ref 11.5–15.5)
WBC: 6.5 10*3/uL (ref 4.0–10.5)
nRBC: 0 % (ref 0.0–0.2)

## 2018-12-20 LAB — URINALYSIS, ROUTINE W REFLEX MICROSCOPIC
Bacteria, UA: NONE SEEN
Bilirubin Urine: NEGATIVE
Glucose, UA: NEGATIVE mg/dL
Hgb urine dipstick: NEGATIVE
Ketones, ur: NEGATIVE mg/dL
Nitrite: NEGATIVE
Protein, ur: NEGATIVE mg/dL
Specific Gravity, Urine: 1.013 (ref 1.005–1.030)
pH: 7 (ref 5.0–8.0)

## 2018-12-20 LAB — HCG, QUANTITATIVE, PREGNANCY: hCG, Beta Chain, Quant, S: 479 m[IU]/mL — ABNORMAL HIGH (ref <5)

## 2018-12-20 LAB — COMPREHENSIVE METABOLIC PANEL
ALT: 10 U/L (ref 0–44)
AST: 18 U/L (ref 15–41)
Albumin: 3.9 g/dL (ref 3.5–5.0)
Alkaline Phosphatase: 64 U/L (ref 38–126)
Anion gap: 10 (ref 5–15)
BUN: 8 mg/dL (ref 6–20)
CO2: 24 mmol/L (ref 22–32)
Calcium: 9.1 mg/dL (ref 8.9–10.3)
Chloride: 101 mmol/L (ref 98–111)
Creatinine, Ser: 0.61 mg/dL (ref 0.44–1.00)
GFR calc Af Amer: >60 mL/min (ref >60)
GFR calc non Af Amer: >60 mL/min (ref >60)
Glucose, Bld: 87 mg/dL (ref 70–99)
Potassium: 3.6 mmol/L (ref 3.5–5.1)
Sodium: 135 mmol/L (ref 135–145)
Total Bilirubin: 0.7 mg/dL (ref 0.3–1.2)
Total Protein: 7 g/dL (ref 6.5–8.1)

## 2018-12-20 MED ORDER — METHOTREXATE FOR ECTOPIC PREGNANCY
50.0000 mg/m2 | Freq: Once | INTRAMUSCULAR | Status: AC
  Administered 2018-12-20: 95 mg via INTRAMUSCULAR
  Filled 2018-12-20: qty 1

## 2018-12-20 MED ORDER — TRAMADOL HCL 50 MG PO TABS: 50.0000 mg | ORAL_TABLET | Freq: Four times a day (QID) | ORAL | 0 refills | Status: AC | PRN

## 2018-12-20 NOTE — MAU Note (Signed)
Pt reports she has been having BHCG levels that are dropping. She has been having lower abd pain no bleeding. Called OB and told to come in to be evaluated for miscarriage vs ectopic.

## 2018-12-20 NOTE — MAU Note (Addendum)
Pt was at the office this morning for blood draw which had dropped again, then started having increased pain. Hcg was 438 today

## 2018-12-20 NOTE — Discharge Instructions (Signed)
Methotrexate Treatment for an Ectopic Pregnancy, Care After °This sheet gives you information about how to care for yourself after your procedure. Your health care provider may also give you more specific instructions. If you have problems or questions, contact your health care provider. °What can I expect after the procedure? °After the procedure, it is common to have: °· Abdominal cramping. °· Vaginal bleeding. °· Fatigue. °· Nausea. °· Vomiting. °· Diarrhea. °Blood tests will be taken at timed intervals for several days or weeks to check your pregnancy hormone levels. The blood tests will be done until the pregnancy hormone can no longer be detected in the blood. °Follow these instructions at home: °Activity °· Do not have sex until your health care provider approves. °· Limit activities that take a lot of effort as told by your health care provider. °Medicines °· Take over the counter and prescription medicines only as told by your health care provider. °· Do not take aspirin, ibuprofen, naproxen, or any other NSAIDs. °· Do not take folic acid, prenatal vitamins, or other vitamins that contain folic acid. °General instructions ° °· Do not drink alcohol. °· Follow instructions from your health care provider on how and when to report any symptoms that may indicate a ruptured ectopic pregnancy. °· Keep all follow-up visits as told by your health care provider. This is important. °Contact a health care provider if: °· You have persistent nausea and vomiting. °· You have persistent diarrhea. °· You are having a reaction to the medicine, such as: °? Tiredness. °? Skin rash. °? Hair loss. °Get help right away if: °· Your abdominal or pelvic pain gets worse. °· You have more vaginal bleeding. °· You feel light-headed or you faint. °· You have shortness of breath. °· Your heart rate increases. °· You develop a cough. °· You have chills. °· You have a fever. °Summary °· After the procedure, it is common to have symptoms  of abdominal cramping, vaginal bleeding and fatigue. You may also experience other symptoms. °· Blood tests will be taken at timed intervals for several days or weeks to check your pregnancy hormone levels. The blood tests will be done until the pregnancy hormone can no longer be detected in the blood. °· Limit strenuous activity as told by your health care provider. °· Follow instructions from your health care provider on how and when to report any symptoms that may indicate a ruptured ectopic pregnancy. °This information is not intended to replace advice given to you by your health care provider. Make sure you discuss any questions you have with your health care provider. °Document Released: 08/11/2011 Document Revised: 10/11/2016 Document Reviewed: 10/11/2016 °Elsevier Interactive Patient Education © 2019 [employer] ° °

## 2018-12-20 NOTE — MAU Provider Note (Addendum)
History     CSN: [id number]  Arrival date and time: 12/20/18 1629   First Provider Initiated Contact with Patient 12/20/18 1729      Chief Complaint  Patient presents with  . Abdominal Pain   HPI   Denise Terry is a 28 y.o. Female G3P1011 @ 7621w5d here with abdominal pain. She has been diagnosed with pregnancy of unknown location on 4/11. The patient has been seen in the MAU for Hcg follow ups. She was also seen in her OB office. Her Hcg levels are slowly declining. She came into MAU today because her pain is on-going and worse today than it has been.   Hcg 4/11: 561 Hcg 4/14: 501 Hcg 4/16: done in office reported by patient: 438  OB History    Gravida  3   Para  1   Term  1   Preterm      AB  1   Living  1     SAB      TAB  1   Ectopic      Multiple  0   Live Births  1           Past Medical History:  Diagnosis Date  . Anemia     Past Surgical History:  Procedure Laterality Date  . DILATION AND EVACUATION N/A 06/04/2016   Procedure: DILATATION AND EVACUATION;  Surgeon: Hoover BrownsEma Kulwa, MD;  Location: [facility];  Service: Gynecology;  Laterality: N/A;  . THERAPEUTIC ABORTION      Family History  Problem Relation Age of Onset  . Diabetes Father   . Diabetes Maternal Grandmother   . Hypertension Maternal Grandmother   . Diabetes Maternal Grandfather   . Hypertension Maternal Grandfather   . Cancer Paternal Grandmother        breast  . Hearing loss Neg Hx     Social History   Tobacco Use  . Smoking status: Never Smoker  . Smokeless tobacco: Never Used  Substance Use Topics  . Alcohol use: No  . Drug use: No    Allergies:  Allergies  Allergen Reactions  . Penicillins Hives and Rash    Has patient had a PCN reaction causing immediate rash, facial/tongue/throat swelling, SOB or lightheadedness with hypotension: NO Has patient had a PCN reaction causing severe rash involving mucus membranes or skin necrosis: No Has patient had a PCN  reaction that required hospitalization No Has patient had a PCN reaction occurring within the last 10 years: No If all of the above answers are "NO", then may proceed with Cephalosporin use.     No medications prior to admission.   No results found for this or any previous visit (from the past 48 hour(s)).    [place] Ob Transvaginal  Result Date: 12/20/2018 CLINICAL DATA:  Dropping beta HCG levels with pelvic pain, initial encounter EXAM: TRANSVAGINAL OB ULTRASOUND TECHNIQUE: Transvaginal ultrasound was performed for complete evaluation of the gestation as well as the maternal uterus, adnexal regions, and pelvic cul-de-sac. COMPARISON:  12/15/2018 FINDINGS: Intrauterine gestational sac: Absent Subchorionic hemorrhage:  None visualized. Maternal uterus/adnexae: Right ovary measures 3.3 x 2.0 x 1.9 cm. Prominent 1.8 cm follicle is again noted within the right ovary. The left ovary measures 2.2 x 1.1 x 1.2 cm. The uterus is within normal limits. IMPRESSION: No definitive intrauterine gestation is identified. Although an early gestation is possible the decreasing beta HCG levels are suspicious. Continued clinical follow-up is recommended. Electronically Signed   By: Alcide CleverMark  Lukens  M.D.   On: 12/20/2018 19:07   Review of Systems  Constitutional: Negative for fever.  Gastrointestinal: Positive for abdominal pain. Negative for nausea and vomiting.  Genitourinary: Negative for vaginal bleeding and vaginal discharge.   Physical Exam   Blood pressure (!) 114/57, pulse 90, temperature 98 F (36.7 C), resp. rate 18, height 5\' 10"  (1.778 m), weight 73.5 kg, last menstrual period 11/10/2018, unknown if currently breastfeeding.  Physical Exam  Constitutional: She is oriented to person, place, and time. She appears well-developed and well-nourished. No distress.  HENT:  Head: Normocephalic.  Respiratory: Effort normal.  GI: Soft. She exhibits no distension and no mass. There is no abdominal tenderness. There  is no rebound and no guarding.  Musculoskeletal: Normal range of motion.  Neurological: She is alert and oriented to person, place, and time.  Skin: Skin is warm. She is not diaphoretic.  Psychiatric: Her behavior is normal.   MAU Course  Procedures  None  MDM  Inappropriate  rise in Hcg levels since 4/11. 2 Ultrasounds showing pregnancy of unknown location. Patient with continued patient. Says she feels emotionally drained and missing work due to pain/ process. Not a desired pregnancy at this time. Given the Hcg levels and [place] will and symptoms will office patient MTX.  Reviewed chart and labs with Dr. Macon Large.   The risks of methotrexate were reviewed including failure requiring repeat dosing or eventual surgery. She understands that methotrexate involves frequent return visits to monitor lab values and that she remains at risk of ectopic rupture until her beta is less than assay. ?The patient opts to proceed with methotrexate.  She has no history of hepatic or renal dysfunction, has normal BUN/Cr/LFT's/platelets.  She is felt to be reliable for follow-up. Side effects of photosensitivity & GI upset were discussed.  She knows to avoid direct sunlight and abstain from alcohol, NSAIDs and sexual intercourse for two weeks. She was counseled to discontinue any MVI with folic acid. ?She understands to follow up on D4 (Sunday 4/19 ) and D7 (4/22 in the office at [facility]) for repeat BHCG and was given the instruction sheet. ?Strict ectopic precautions were reviewed, the patient knows to call with any abdominal pain, vomiting, fainting, or any concerns with her health.  Day 0/1 Day 4 Day 7  Sunday Wednesday Saturday  Monday Thursday Sunday  Tuesday Friday Monday  Wednesday Saturday Tuesday  Thursday Sunday Wednesday  Friday Monday Thursday  Saturday Tuesday Friday    O positive blood type    Assessment and Plan   A:  1. Inappropriate change in quantitative hCG in early pregnancy   2.  Pregnancy of unknown anatomic location   3. Abdominal pain in pregnancy, first trimester     P:  Discharge home in stable condition Rx: Tramadol # 2 just for tonight's use for patient to rest Return Sunday for Day 4 labs and then Wednesday in Her OB office for labs Return sooner if symptoms worsen Pelvic rest Ectopic precautions.    Duane Lope, NP 12/20/2018 8:49 PM

## 2018-12-23 ENCOUNTER — Other Ambulatory Visit: Payer: Self-pay

## 2018-12-23 ENCOUNTER — Inpatient Hospital Stay (HOSPITAL_COMMUNITY)
Admission: AD | Admit: 2018-12-23 | Discharge: 2018-12-23 | Disposition: A | Discharge status: Home / Self Care | Payer: BLUE CROSS/BLUE SHIELD | Source: Ambulatory Visit | Attending: Obstetrics and Gynecology | Admitting: Obstetrics and Gynecology

## 2018-12-23 DIAGNOSIS — O26891 Other specified pregnancy related conditions, first trimester: Secondary | ICD-10-CM | POA: Insufficient documentation

## 2018-12-23 DIAGNOSIS — Z3A01 Less than 8 weeks gestation of pregnancy: Secondary | ICD-10-CM | POA: Diagnosis not present

## 2018-12-23 DIAGNOSIS — O3680X0 Pregnancy with inconclusive fetal viability, not applicable or unspecified: Secondary | ICD-10-CM | POA: Diagnosis not present

## 2018-12-23 DIAGNOSIS — R102 Pelvic and perineal pain: Secondary | ICD-10-CM | POA: Insufficient documentation

## 2018-12-23 LAB — HCG, QUANTITATIVE, PREGNANCY: hCG, Beta Chain, Quant, S: 391 m[IU]/mL — ABNORMAL HIGH (ref <5)

## 2018-12-23 NOTE — MAU Provider Note (Signed)
Subjective:  ILYN Terry is a 28 y.o. G3P1011 at [redacted]w[redacted]d who presents today for FU BHCG. She was seen on 4/16 . Results from that day show no IUP on [place], and HCG 479. She reports scant vaginal bleeding. She reports mild abdominal or pelvic pain. No worsening symptoms since previous visit.   Objective:  Physical Exam  Nursing note and vitals reviewed. Constitutional: She is oriented to person, place, and time. She appears well-developed and well-nourished. No distress.  HENT:  Head: Normocephalic.  Cardiovascular: Normal rate.  Respiratory: Effort normal.  GI: Soft. There is no tenderness.  Neurological: She is alert and oriented to person, place, and time. Skin: Skin is warm and dry.  Psychiatric: She has a normal mood and affect.   Results for orders placed or performed during the hospital encounter of 12/23/18 (from the past 24 hour(s))  hCG, quantitative, pregnancy     Status: Abnormal   Collection Time: 12/23/18 11:30 AM  Result Value Ref Range   hCG, Beta Chain, Quant, S 391 (H) <5 mIU/mL    Assessment/Plan: Pregnancy of unknown location; presumed ectopic pregnancy. HCG declined  FU in the office at [facility] on Wednesday 4/22 for Day 7 labs. A message was left with the after hours staff to call the patient to schedule this visit Strict MAU return precautions Ok to use Tylenol OTC Pelvic rest  Shanora Christensen, Harolyn Rutherford, NP 12/23/2018 1:49 PM

## 2018-12-23 NOTE — MAU Note (Signed)
Denise Terry is a 28 y.o. at [redacted]w[redacted]d here in MAU reporting: here for day 4 post mtx hcg level, states she has been having intermittent cramping since the injection but none currently. Also is having some spotting.  Pain score: 0/10  Vitals:   12/23/18 1138  BP: (!) 121/55  Pulse: 81  Resp: 18  Temp: 97.8 F (36.6 C)  SpO2: 100%      Lab orders placed from triage: hcg order released

## 2018-12-23 NOTE — MAU Note (Signed)
Informed Rasch NP that results are back. NP states okay to dc patient off the board and plans to call her with results.

## 2018-12-23 NOTE — MAU Note (Signed)
Pt okay to leave per Venia Carbon NP, states she will call the patient with results. Leave patient on MAU board and notify NP when results are back.

## 2018-12-28 ENCOUNTER — Inpatient Hospital Stay (HOSPITAL_COMMUNITY)
Admission: AD | Admit: 2018-12-28 | Discharge: 2018-12-28 | Disposition: A | Discharge status: Home / Self Care | Payer: BLUE CROSS/BLUE SHIELD | Attending: Obstetrics and Gynecology | Admitting: Obstetrics and Gynecology

## 2018-12-28 ENCOUNTER — Other Ambulatory Visit: Payer: Self-pay

## 2018-12-28 DIAGNOSIS — Z79899 Other long term (current) drug therapy: Secondary | ICD-10-CM | POA: Diagnosis not present

## 2018-12-28 DIAGNOSIS — O009 Unspecified ectopic pregnancy without intrauterine pregnancy: Secondary | ICD-10-CM

## 2018-12-28 DIAGNOSIS — O99011 Anemia complicating pregnancy, first trimester: Secondary | ICD-10-CM | POA: Insufficient documentation

## 2018-12-28 DIAGNOSIS — Z833 Family history of diabetes mellitus: Secondary | ICD-10-CM | POA: Diagnosis not present

## 2018-12-28 DIAGNOSIS — Z803 Family history of malignant neoplasm of breast: Secondary | ICD-10-CM | POA: Diagnosis not present

## 2018-12-28 DIAGNOSIS — Z880 Allergy status to penicillin: Secondary | ICD-10-CM | POA: Diagnosis not present

## 2018-12-28 DIAGNOSIS — D649 Anemia, unspecified: Secondary | ICD-10-CM | POA: Insufficient documentation

## 2018-12-28 DIAGNOSIS — Z3A01 Less than 8 weeks gestation of pregnancy: Secondary | ICD-10-CM | POA: Insufficient documentation

## 2018-12-28 DIAGNOSIS — Z5181 Encounter for therapeutic drug level monitoring: Secondary | ICD-10-CM | POA: Diagnosis not present

## 2018-12-28 NOTE — Discharge Instructions (Signed)
Methotrexate Treatment for an Ectopic Pregnancy, Care After °This sheet gives you information about how to care for yourself after your procedure. Your health care provider may also give you more specific instructions. If you have problems or questions, contact your health care provider. °What can I expect after the procedure? °After the procedure, it is common to have: °· Abdominal cramping. °· Vaginal bleeding. °· Fatigue. °· Nausea. °· Vomiting. °· Diarrhea. °Blood tests will be taken at timed intervals for several days or weeks to check your pregnancy hormone levels. The blood tests will be done until the pregnancy hormone can no longer be detected in the blood. °Follow these instructions at home: °Activity °· Do not have sex until your health care provider approves. °· Limit activities that take a lot of effort as told by your health care provider. °Medicines °· Take over the counter and prescription medicines only as told by your health care provider. °· Do not take aspirin, ibuprofen, naproxen, or any other NSAIDs. °· Do not take folic acid, prenatal vitamins, or other vitamins that contain folic acid. °General instructions ° °· Do not drink alcohol. °· Follow instructions from your health care provider on how and when to report any symptoms that may indicate a ruptured ectopic pregnancy. °· Keep all follow-up visits as told by your health care provider. This is important. °Contact a health care provider if: °· You have persistent nausea and vomiting. °· You have persistent diarrhea. °· You are having a reaction to the medicine, such as: °? Tiredness. °? Skin rash. °? Hair loss. °Get help right away if: °· Your abdominal or pelvic pain gets worse. °· You have more vaginal bleeding. °· You feel light-headed or you faint. °· You have shortness of breath. °· Your heart rate increases. °· You develop a cough. °· You have chills. °· You have a fever. °Summary °· After the procedure, it is common to have symptoms  of abdominal cramping, vaginal bleeding and fatigue. You may also experience other symptoms. °· Blood tests will be taken at timed intervals for several days or weeks to check your pregnancy hormone levels. The blood tests will be done until the pregnancy hormone can no longer be detected in the blood. °· Limit strenuous activity as told by your health care provider. °· Follow instructions from your health care provider on how and when to report any symptoms that may indicate a ruptured ectopic pregnancy. °This information is not intended to replace advice given to you by your health care provider. Make sure you discuss any questions you have with your health care provider. °Document Released: 08/11/2011 Document Revised: 10/11/2016 Document Reviewed: 10/11/2016 °Elsevier Interactive Patient Education © 2019 [employer] ° °

## 2018-12-28 NOTE — MAU Note (Signed)
Pt here for follow up lab, had MSE and scheduled lab for for outpatient at clinic

## 2018-12-28 NOTE — MAU Provider Note (Signed)
History     CSN: [id number]  Arrival date and time: 12/28/18 1537   First Provider Initiated Contact with Patient 12/28/18 0912      Chief Complaint  Patient presents with  . Follow-up   Denise Terry is a 28 y.o. G3P1011 at [redacted]w[redacted]d who had MTX for presumed ectopic pregnancy on 12/20/2018. HCG on day # 4 labs was decreasing and was 391. She saw her OB provider for day #7 labs on 12/26/2018. She states that they called her and told her that the hcg level was 30, and that they would not follow her any longer since she was not pregnancy. She was advised to come here today for repeat HCG. She denies any pain or bleeding at this time. No concerns at this time. She is just here for HCG check.    OB History    Gravida  3   Para  1   Term  1   Preterm      AB  1   Living  1     SAB      TAB  1   Ectopic      Multiple  0   Live Births  1           Past Medical History:  Diagnosis Date  . Anemia     Past Surgical History:  Procedure Laterality Date  . DILATION AND EVACUATION N/A 06/04/2016   Procedure: DILATATION AND EVACUATION;  Surgeon: Hoover Browns, MD;  Location: [facility];  Service: Gynecology;  Laterality: N/A;  . THERAPEUTIC ABORTION      Family History  Problem Relation Age of Onset  . Diabetes Father   . Diabetes Maternal Grandmother   . Hypertension Maternal Grandmother   . Diabetes Maternal Grandfather   . Hypertension Maternal Grandfather   . Cancer Paternal Grandmother        breast  . Hearing loss Neg Hx     Social History   Tobacco Use  . Smoking status: Never Smoker  . Smokeless tobacco: Never Used  Substance Use Topics  . Alcohol use: No  . Drug use: No    Allergies:  Allergies  Allergen Reactions  . Penicillins Hives and Rash    Has patient had a PCN reaction causing immediate rash, facial/tongue/throat swelling, SOB or lightheadedness with hypotension: NO Has patient had a PCN reaction causing severe rash involving mucus membranes  or skin necrosis: No Has patient had a PCN reaction that required hospitalization No Has patient had a PCN reaction occurring within the last 10 years: No If all of the above answers are "NO", then may proceed with Cephalosporin use.     No medications prior to admission.    Review of Systems Physical Exam   Blood pressure 112/62, pulse 92, temperature 98.4 F (36.9 C), temperature source Oral, resp. rate 16, last menstrual period 11/10/2018, SpO2 99 %, unknown if currently breastfeeding.  Physical Exam  Nursing note and vitals reviewed. Constitutional: She is oriented to person, place, and time. She appears well-developed and well-nourished. No distress.  HENT:  Head: Normocephalic.  Cardiovascular: Normal rate.  Respiratory: Effort normal.  Neurological: She is alert and oriented to person, place, and time.  Skin: Skin is warm and dry.  Psychiatric: She has a normal mood and affect.    MAU Course  Procedures  MDM DW patient that typically if the day #7 labs are decreasing appropriately then we would repeat HCG weekly until less than 5. Offered  HCG check today with the realization that she will possibly need another check in one week or the option to have a hcg done in the [facility] one week from the day 7 level. Patient would prefer to have one done in one week so that she hopefully does not need to come back again. Appt made for the patient on 01/02/2019 at 8:20.  Assessment and Plan   1. Encounter for monitoring of methotrexate therapy   2. Ectopic pregnancy, unspecified location, unspecified whether intrauterine pregnancy present    DC home Bleeding precautions Ectopic precautions RX: none  Return to MAU as needed FU with [facility] for HCG on 4/292/2020  Follow-up Information    [facility].   Specialty:  Obstetrics and Gynecology Contact information: [address], [address] [id number] [place]  [place] [phone]

## 2019-01-02 ENCOUNTER — Other Ambulatory Visit: Payer: Self-pay

## 2019-01-02 ENCOUNTER — Other Ambulatory Visit: Payer: BLUE CROSS/BLUE SHIELD

## 2019-01-02 DIAGNOSIS — O009 Unspecified ectopic pregnancy without intrauterine pregnancy: Secondary | ICD-10-CM

## 2019-01-03 ENCOUNTER — Other Ambulatory Visit: Payer: Self-pay | Admitting: Advanced Practice Midwife

## 2019-01-03 LAB — BETA HCG QUANT (REF LAB): hCG Quant: <1 m[IU]/mL

## 2019-01-03 NOTE — Progress Notes (Signed)
Hcg less than 1.  Thressa Sheller DNP, CNM  01/03/19  8:03 AM

## 2019-01-04 ENCOUNTER — Encounter: Payer: Self-pay | Admitting: Family Medicine

## 2019-01-04 ENCOUNTER — Telehealth: Payer: Self-pay | Admitting: *Deleted

## 2019-01-04 NOTE — Telephone Encounter (Signed)
Denise Terry called this am and left  A voice message she is having a hard time getting her results from Wednesday and would like a call.

## 2019-01-04 NOTE — Telephone Encounter (Signed)
I called Sinclair and notified her bhcg was <1 on 01/02/19. I informed her she will need follow up but I do not see when/how provider wanted the follow up. She states she understood if it was normal ; she was done. I informed her I will send a message to provider and we will call her back if follow up is orderd. I also reminded her if she has severe pain or heavy bleeding to go to MAU. She voices understanding.

## 2019-01-07 NOTE — Telephone Encounter (Signed)
Reviewed with Dr.Stinson and called patient and notified her no further follow up needed since level <1; but may call us if needed for any issues. She voices understanding.

## 2019-09-25 IMAGING — US OBSTETRIC <14 WK US AND TRANSVAGINAL OB US
1 series · 15 of 28 positions shown · non-contrast
Comparison: Pelvic ultrasound 06/04/2016.

CLINICAL DATA: First trimester pregnancy. Abdominal pain. Serum
beta HCG level 561. LMP 11/10/2018.

EXAM:
OBSTETRIC <14 WK US AND TRANSVAGINAL OB US
TECHNIQUE: Both transabdominal and transvaginal ultrasound examinations were
performed for complete evaluation of the gestation as well as the
maternal uterus, adnexal regions, and pelvic cul-de-sac.
Transvaginal technique was performed to assess early pregnancy.

[Series 1: obstetric <14 wk us and transvaginal ob us · 97 acquisitions, 15 frames shown]
[im 1/97]
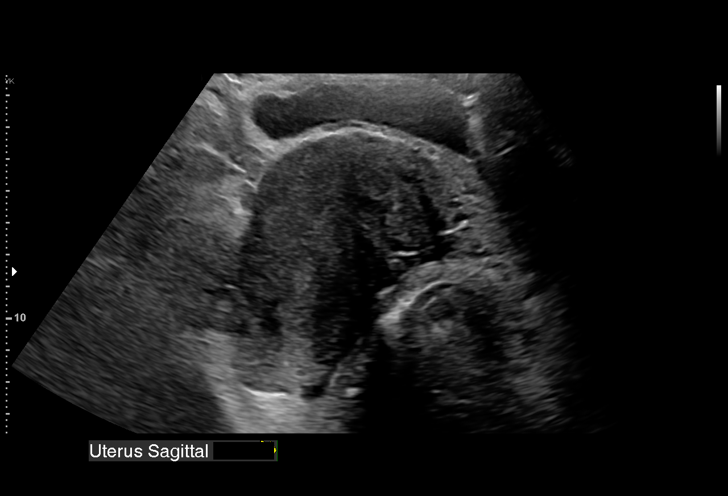
[im 8/97]
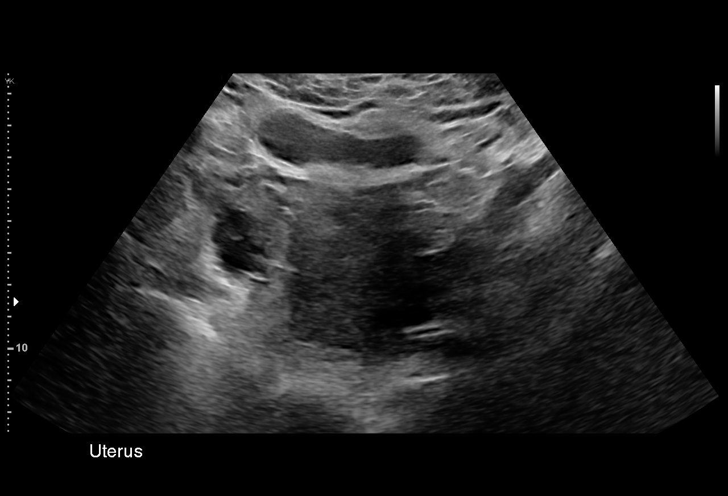
[im 15/97]
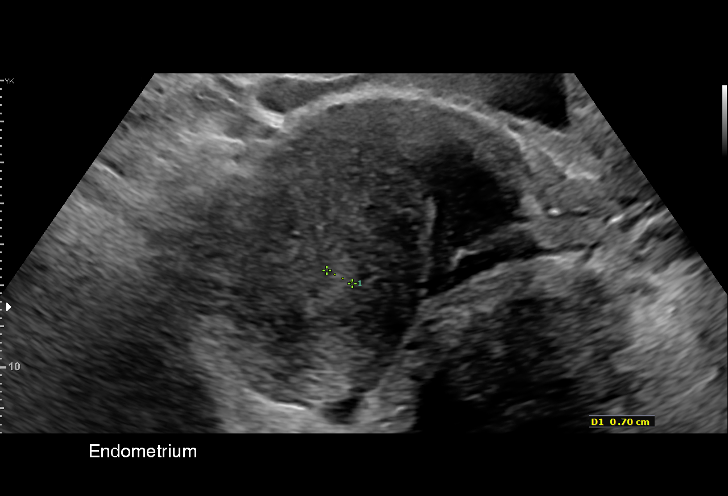
[im 22/97]
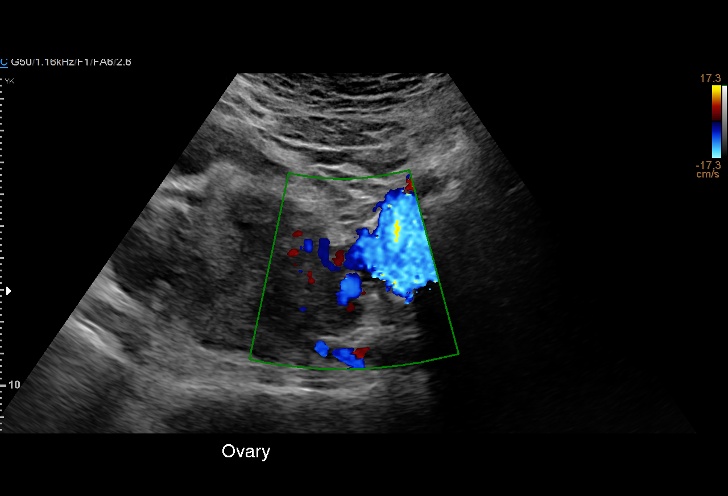
[im 29/97]
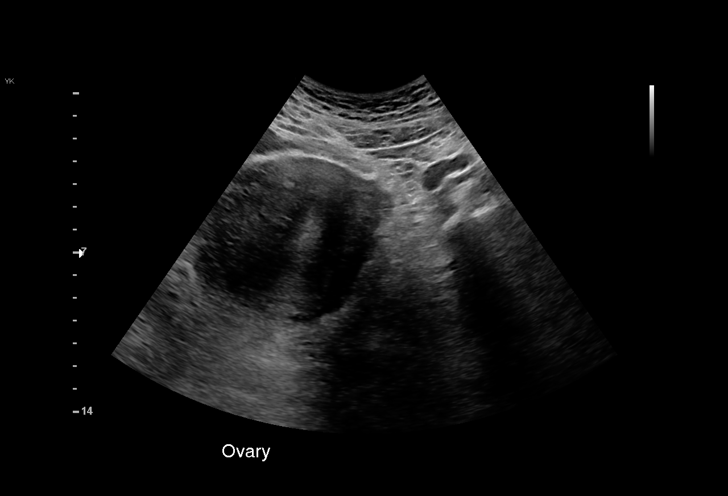
[im 36/97]
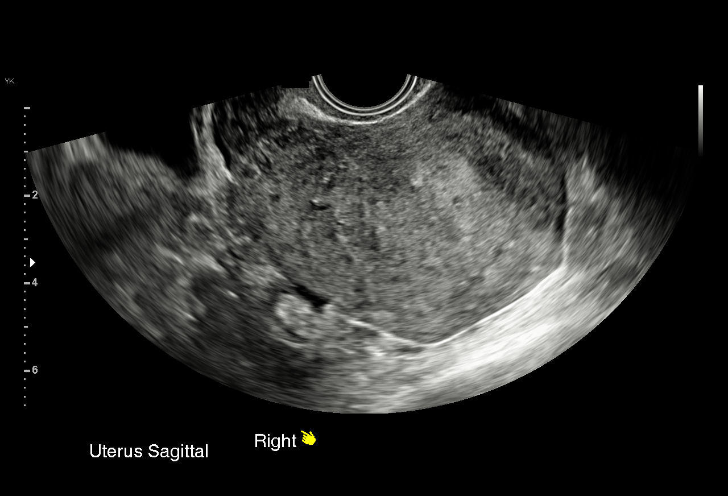
[im 43/97]
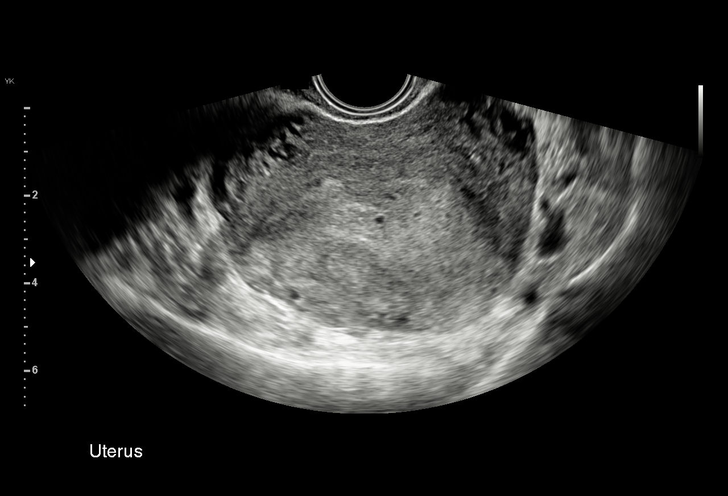
[im 50/97]
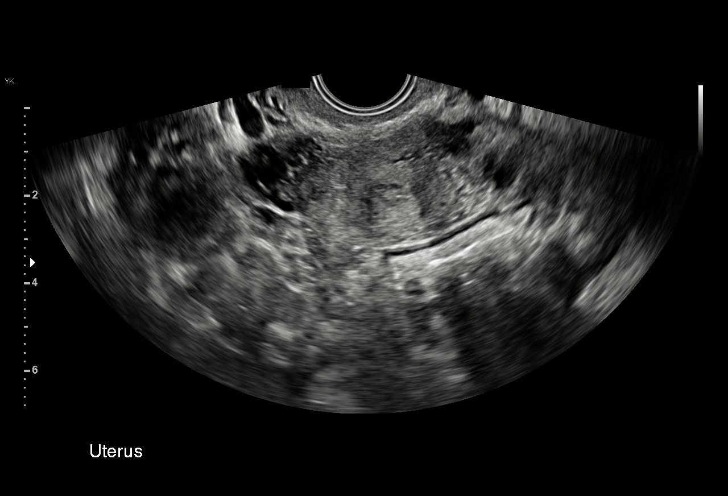
[im 54/97]
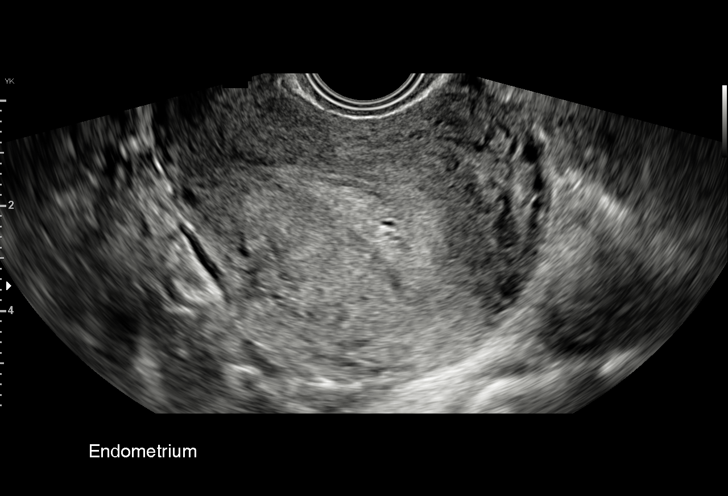
[im 61/97]
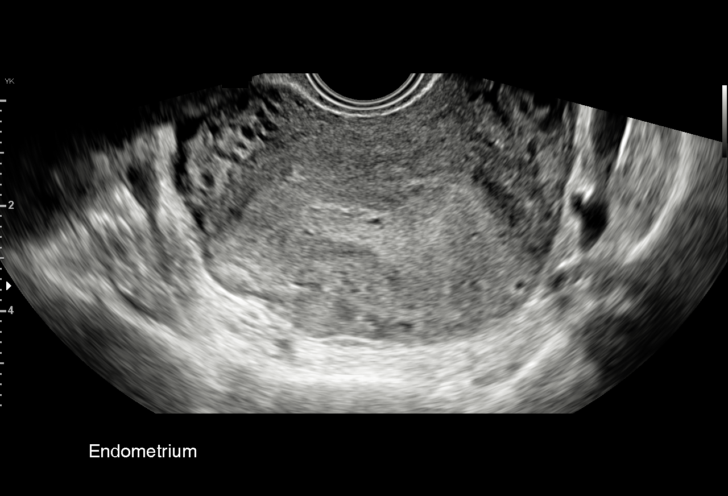
[im 68/97]
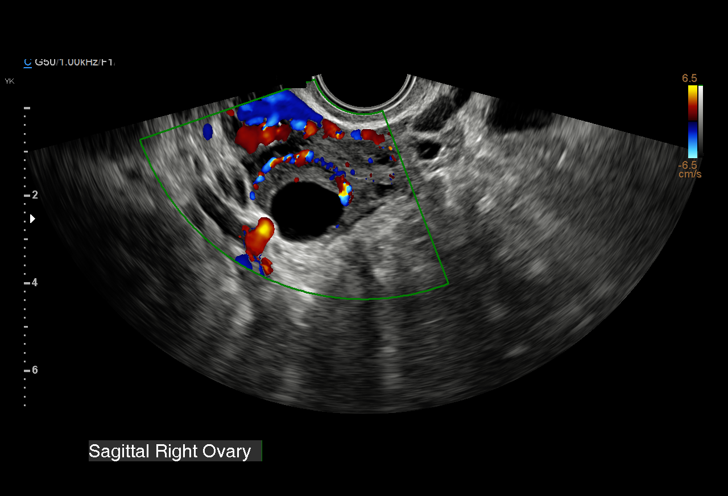
[im 75/97]
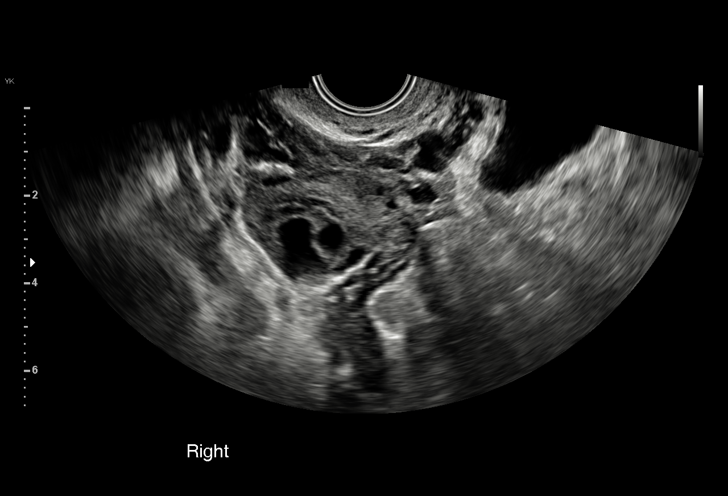
[im 82/97]
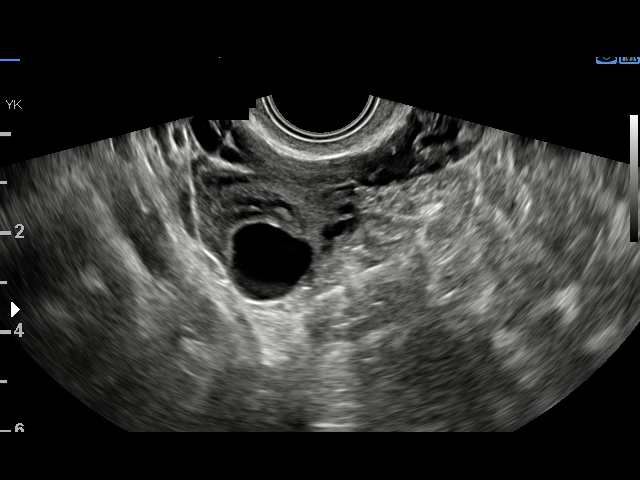
[im 89/97]
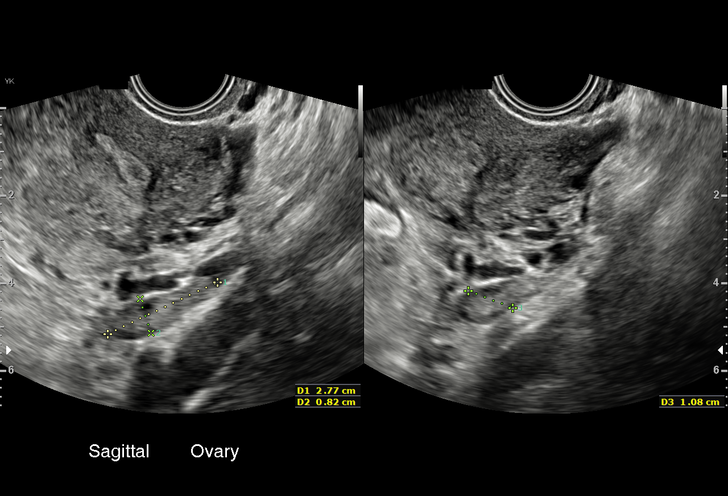
[im 97/97]
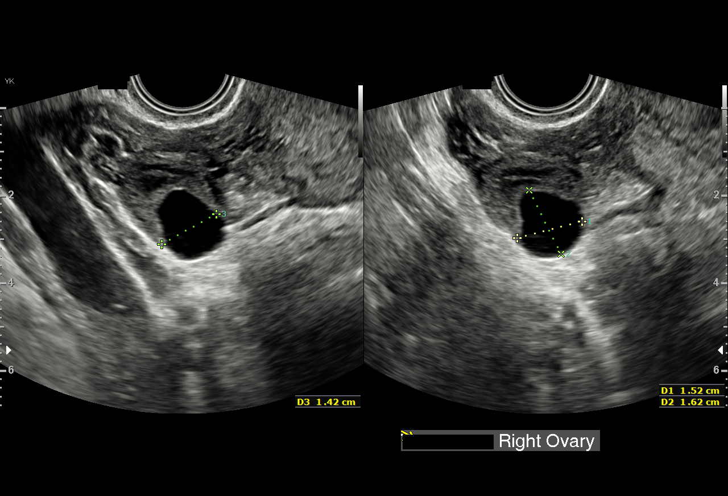

[15 of 28 positions shown; findings below may reference images not displayed]

FINDINGS: Intrauterine gestational sac: Not identified.

Yolk sac:  Absent.

Embryo:  Absent.

Subchorionic hemorrhage:  None visualized.

Maternal uterus/adnexae: Small right ovarian corpus luteum/follicle,
measuring 16 mm maximally. No suspicious adnexal findings or free
pelvic fluid.
IMPRESSION: No intrauterine gestational sac, yolk sac, fetal pole, or cardiac
activity visualized. Differential considerations include
intrauterine gestation too early to be sonographically visualized,
spontaneous abortion, or ectopic pregnancy. Consider follow-up
ultrasound in 14 days and serial quantitative beta HCG follow-up.

## 2019-09-30 IMAGING — US TRANSVAGINAL OB ULTRASOUND
1 series · 15 of 20 positions shown · non-contrast
Comparison: 12/15/2018

CLINICAL DATA: Dropping beta HCG levels with pelvic pain, initial
encounter

EXAM:
TRANSVAGINAL OB ULTRASOUND
TECHNIQUE: Transvaginal ultrasound was performed for complete evaluation of the
gestation as well as the maternal uterus, adnexal regions, and
pelvic cul-de-sac.

[Series 1: transvaginal ob ultrasound · 15 of 20 slices shown]
[im 1/20]
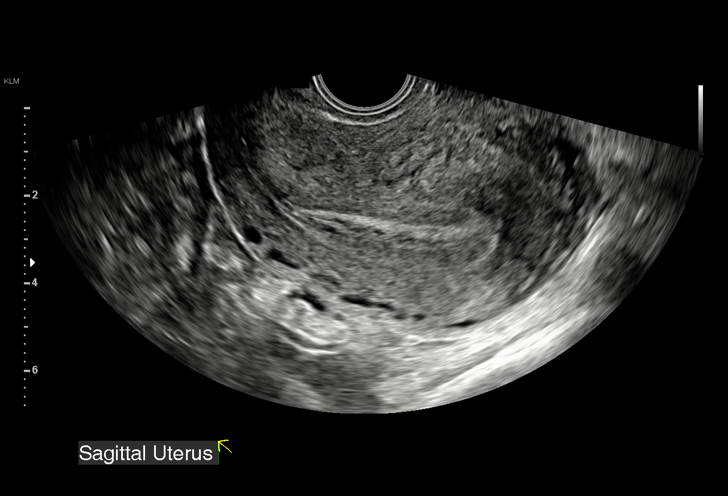
[im 3/20]
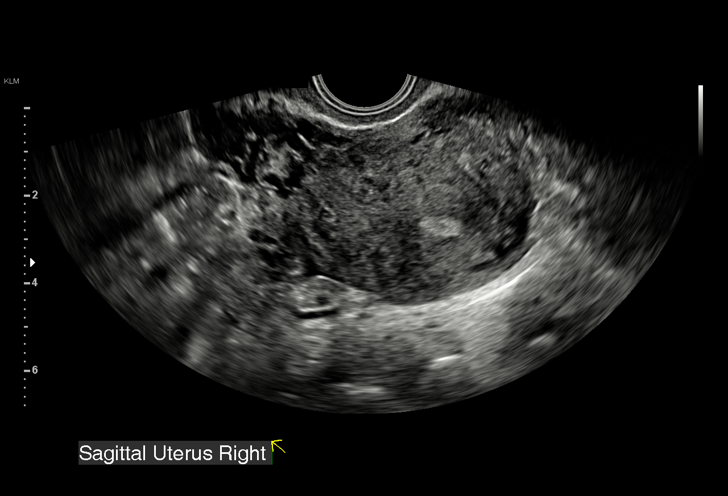
[im 4/20]
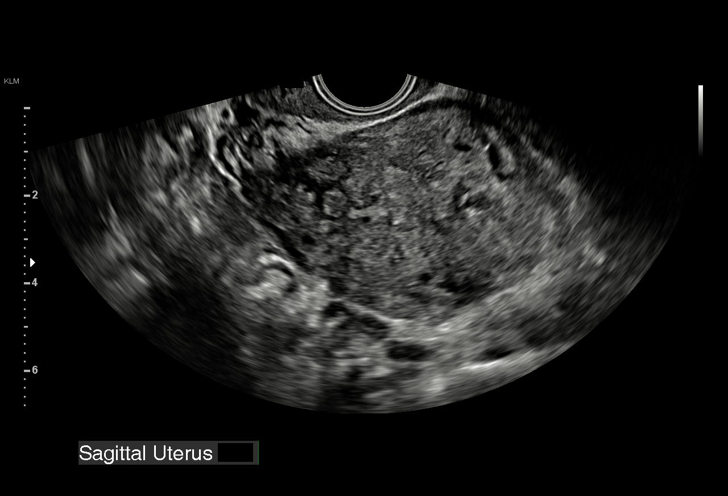
[im 5/20]
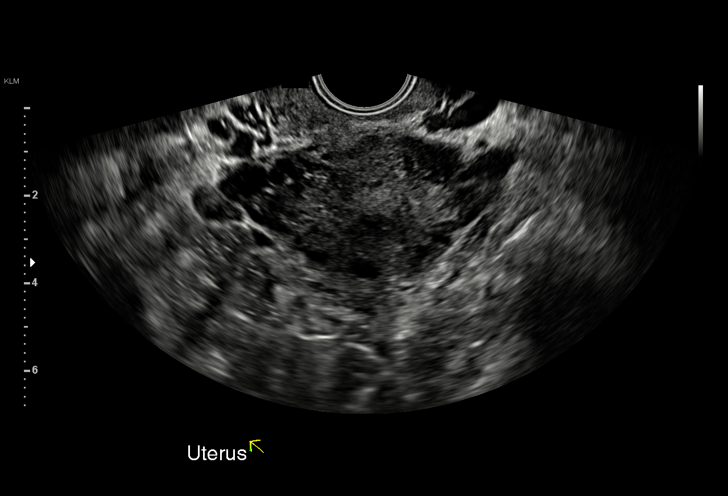
[im 7/20]
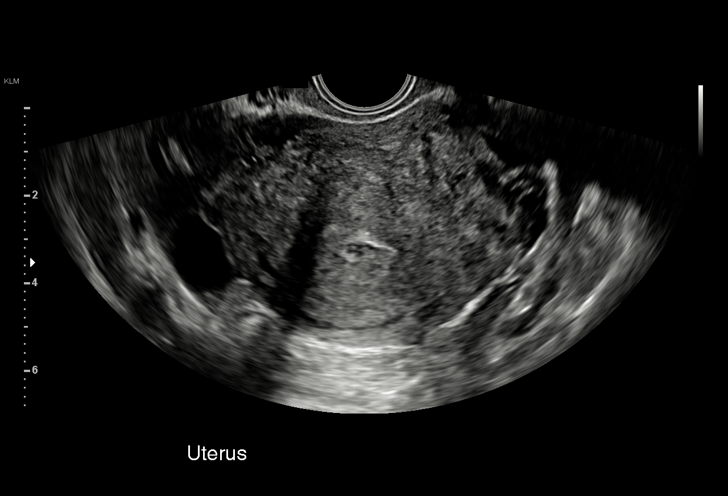
[im 8/20]
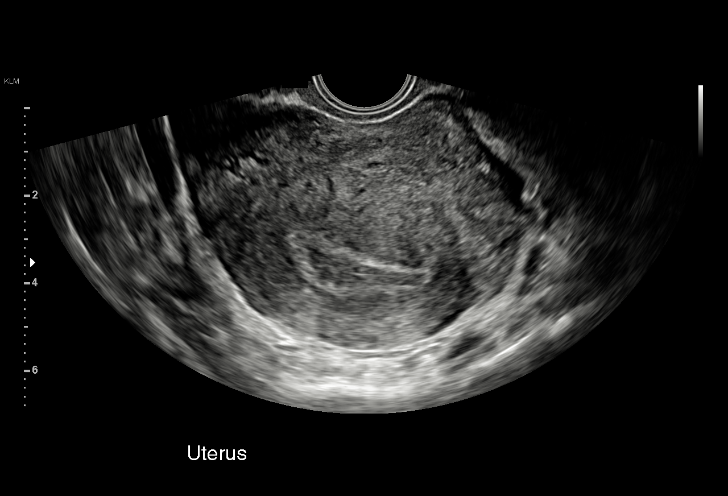
[im 9/20]
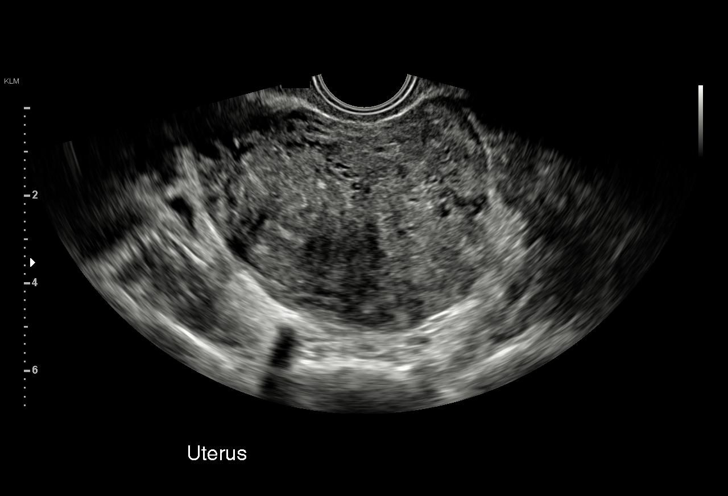
[im 11/20]
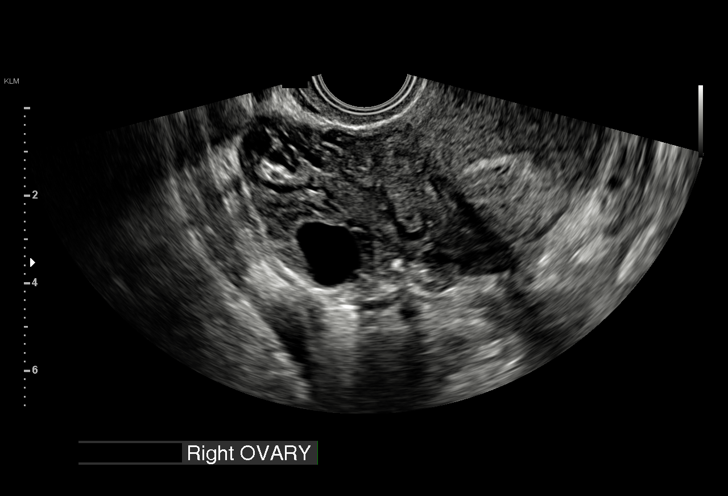
[im 12/20]
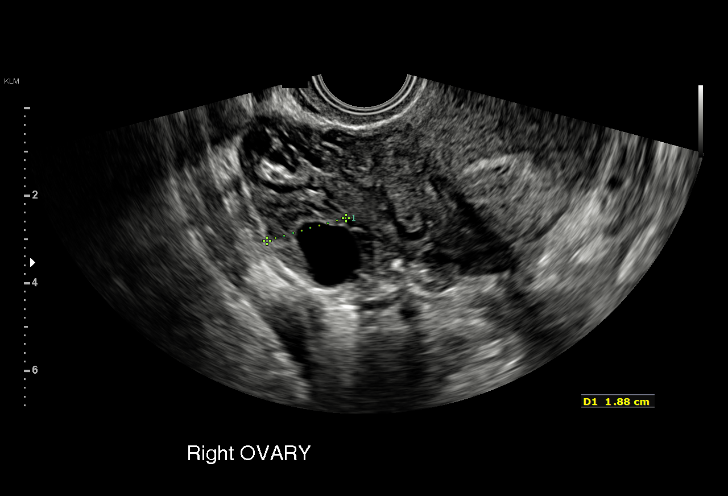
[im 13/20]
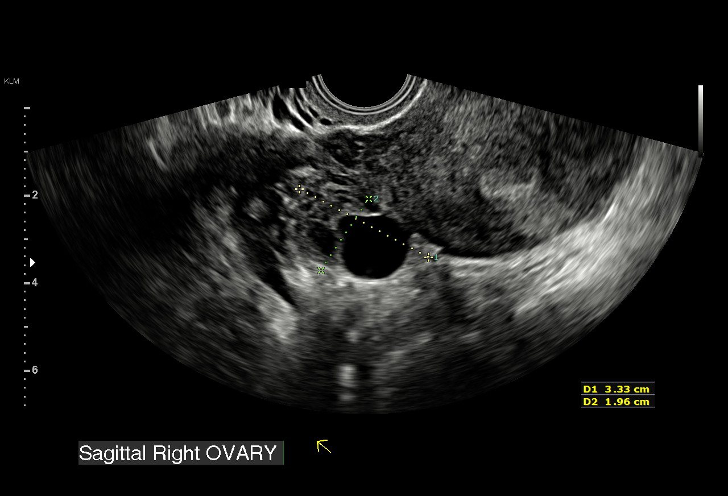
[im 15/20]
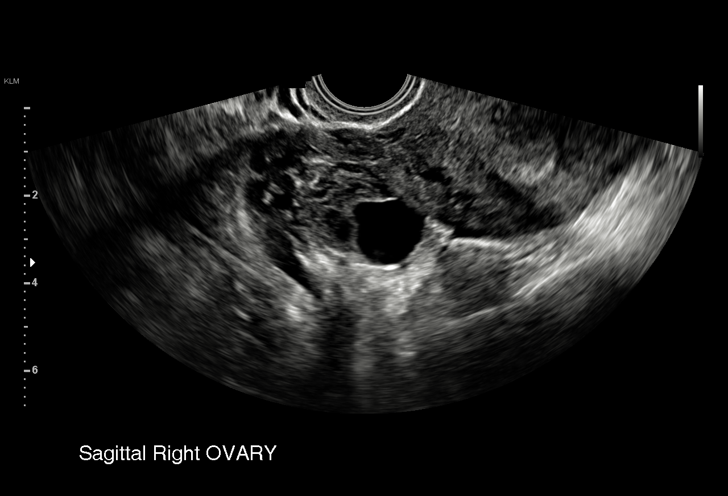
[im 16/20]
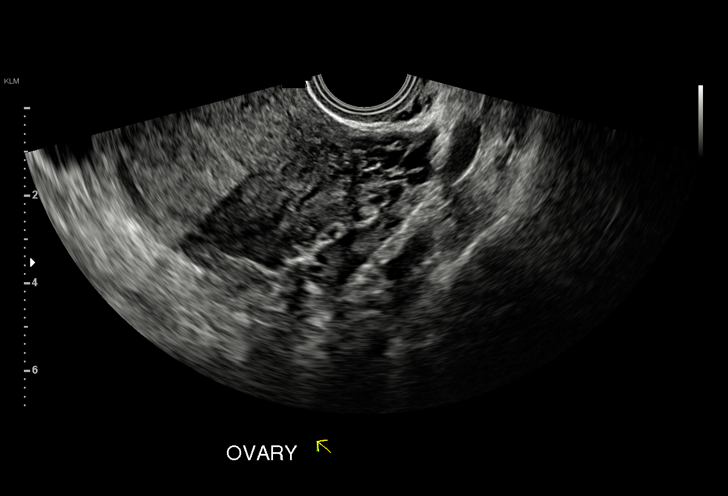
[im 17/20]
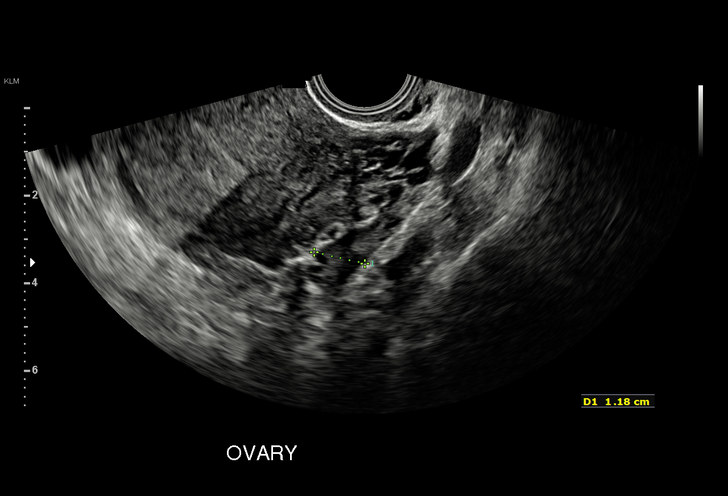
[im 19/20]
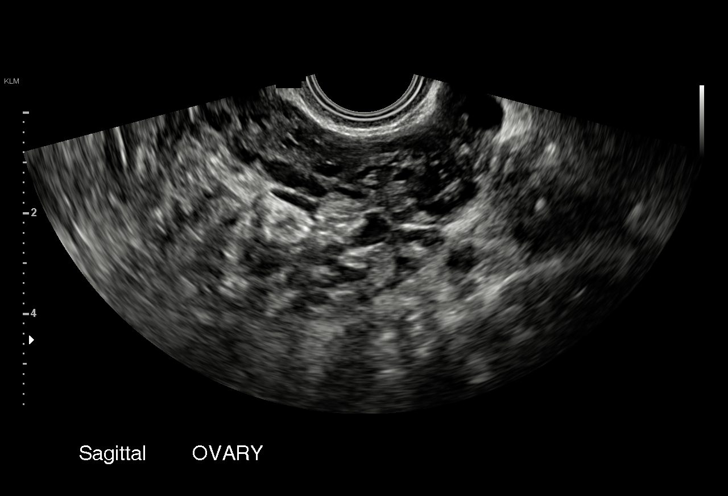
[im 20/20]
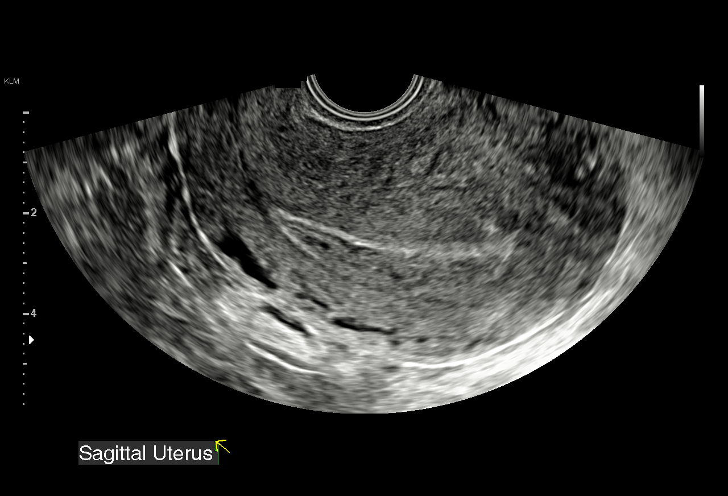

[15 of 20 positions shown; findings below may reference images not displayed]

FINDINGS: Intrauterine gestational sac: Absent

Subchorionic hemorrhage:  None visualized.

Maternal uterus/adnexae: Right ovary measures 3.3 x 2.0 x 1.9 cm.
Prominent 1.8 cm follicle is again noted within the right ovary. The
left ovary measures 2.2 x 1.1 x 1.2 cm. The uterus is within normal
limits.
IMPRESSION: No definitive intrauterine gestation is identified. Although an
early gestation is possible the decreasing beta HCG levels are
suspicious. Continued clinical follow-up is recommended.

## 2019-12-13 DIAGNOSIS — N898 Other specified noninflammatory disorders of vagina: Secondary | ICD-10-CM | POA: Diagnosis not present

## 2019-12-13 DIAGNOSIS — Z113 Encounter for screening for infections with a predominantly sexual mode of transmission: Secondary | ICD-10-CM | POA: Diagnosis not present

## 2019-12-13 DIAGNOSIS — Z30013 Encounter for initial prescription of injectable contraceptive: Secondary | ICD-10-CM | POA: Diagnosis not present

## 2019-12-13 DIAGNOSIS — Z309 Encounter for contraceptive management, unspecified: Secondary | ICD-10-CM | POA: Diagnosis not present

## 2019-12-13 DIAGNOSIS — N760 Acute vaginitis: Secondary | ICD-10-CM | POA: Diagnosis not present

## 2019-12-27 DIAGNOSIS — Z3202 Encounter for pregnancy test, result negative: Secondary | ICD-10-CM | POA: Diagnosis not present

## 2019-12-27 DIAGNOSIS — Z30013 Encounter for initial prescription of injectable contraceptive: Secondary | ICD-10-CM | POA: Diagnosis not present

## 2020-04-15 ENCOUNTER — Other Ambulatory Visit (HOSPITAL_COMMUNITY)
Admission: RE | Admit: 2020-04-15 | Discharge: 2020-04-15 | Disposition: A | Discharge status: Home / Self Care | Payer: BC Managed Care – PPO | Source: Ambulatory Visit | Attending: Internal Medicine | Admitting: Internal Medicine

## 2020-04-15 ENCOUNTER — Other Ambulatory Visit: Payer: Self-pay

## 2020-04-15 ENCOUNTER — Encounter: Payer: Self-pay | Admitting: Internal Medicine

## 2020-04-15 ENCOUNTER — Ambulatory Visit: Payer: BC Managed Care – PPO | Admitting: Internal Medicine

## 2020-04-15 VITALS — BP 121/82 | HR 85 | Temp 98.4°F | Ht 70.0 in | Wt 177.1 lb

## 2020-04-15 DIAGNOSIS — Z124 Encounter for screening for malignant neoplasm of cervix: Secondary | ICD-10-CM

## 2020-04-15 DIAGNOSIS — Z0000 Encounter for general adult medical examination without abnormal findings: Secondary | ICD-10-CM | POA: Diagnosis not present

## 2020-04-15 NOTE — Patient Instructions (Addendum)
It was nice seeing you today! Thank you for choosing [facility] for your Primary Care.    Today we talked about:   1. Pap Smear: Your results will come up on MyChart in just a couple days. I can also call you with the results.

## 2020-04-15 NOTE — Assessment & Plan Note (Signed)
Pap smear performed today. Results pending. 

## 2020-04-15 NOTE — Progress Notes (Addendum)
   CC: Annual visit  HPI:  [name] is a 29 y.o. with a PMHx of ectopic pregnancy who presents to the clinic for annual visit.   Please see the Encounters tab for problem-based Assessment & Plan regarding status of patient's acute and chronic conditions.  Past Medical History:  Diagnosis Date  . Anemia   . Retained products of conception following abortion 06/04/2016  . Vaginal delivery 07/18/2014   Past Surgical History:  - D&C  Past Family History:  - Diabetes  - Abnormal pap smear (sister)  Social History:  - Social drinker  - No tobacco use or drug use  Review of Systems: Review of Systems  Constitutional: Negative for chills, fever, malaise/fatigue and weight loss.  Respiratory: Negative for cough, shortness of breath and wheezing.   Cardiovascular: Negative for chest pain, palpitations and leg swelling.  Gastrointestinal: Negative for abdominal pain, diarrhea, nausea and vomiting.  Genitourinary: Negative for dysuria, frequency, hematuria and urgency.  Musculoskeletal: Negative for joint pain and myalgias.  Neurological: Positive for dizziness (once a few days ago, resolved quickly). Negative for headaches.  Psychiatric/Behavioral: Negative for substance abuse.   Physical Exam:  Vitals:   04/15/20 1323  BP: 121/82  Pulse: 85  Temp: 98.4 F (36.9 C)  TempSrc: Oral  SpO2: 100%  Weight: 177 lb 1.6 oz (80.3 kg)  Height: 5\' 10"  (1.778 m)   Physical Exam Vitals and nursing note reviewed. Exam conducted with a chaperone present.  Constitutional:      General: She is not in acute distress.    Appearance: She is normal weight.  HENT:     Head: Normocephalic and atraumatic.     Mouth/Throat:     Mouth: Mucous membranes are moist.     Pharynx: Oropharynx is clear.  Eyes:     Extraocular Movements: Extraocular movements intact.     Conjunctiva/sclera: Conjunctivae normal.  Pulmonary:     Effort: Pulmonary effort is normal. No respiratory distress.   Genitourinary:    General: Normal vulva.     Pubic Area: No rash.      Labia:        Right: No rash, tenderness, lesion or injury.        Left: No rash, tenderness, lesion or injury.      Vagina: No vaginal discharge, erythema, tenderness, bleeding or lesions.     Cervix: No discharge.     Comments: Anteverted cervix Skin:    General: Skin is warm and dry.  Neurological:     General: No focal deficit present.     Mental Status: She is alert and oriented to person, place, and time. Mental status is at baseline.     Gait: Gait normal.  Psychiatric:        Mood and Affect: Mood normal.        Behavior: Behavior normal.    Assessment & Plan:   [name] is present for a preventative visit. No acute problems at this time to address.   Patient discussed with Dr. Leanne Chang

## 2020-04-16 NOTE — Progress Notes (Signed)
Internal Medicine Clinic Attending  Case discussed with Dr. Basaraba  At the time of the visit.  We reviewed the resident's history and exam and pertinent patient test results.  I agree with the assessment, diagnosis, and plan of care documented in the resident's note.  

## 2020-04-17 LAB — CYTOLOGY - PAP
Comment: NEGATIVE
Diagnosis: NEGATIVE
High risk HPV: NEGATIVE

## 2021-02-16 ENCOUNTER — Ambulatory Visit: Payer: BC Managed Care – PPO | Admitting: Internal Medicine

## 2023-02-10 ENCOUNTER — Inpatient Hospital Stay (HOSPITAL_COMMUNITY)
Admission: AD | Admit: 2023-02-10 | Discharge: 2023-02-10 | Disposition: A | Discharge status: Home / Self Care | Payer: BLUE CROSS/BLUE SHIELD | Attending: Obstetrics and Gynecology | Admitting: Obstetrics and Gynecology

## 2023-02-10 ENCOUNTER — Encounter (HOSPITAL_COMMUNITY): Payer: Self-pay | Admitting: *Deleted

## 2023-02-10 ENCOUNTER — Inpatient Hospital Stay (HOSPITAL_COMMUNITY): Payer: BLUE CROSS/BLUE SHIELD

## 2023-02-10 DIAGNOSIS — R109 Unspecified abdominal pain: Secondary | ICD-10-CM

## 2023-02-10 DIAGNOSIS — R7989 Other specified abnormal findings of blood chemistry: Secondary | ICD-10-CM | POA: Diagnosis not present

## 2023-02-10 DIAGNOSIS — O020 Blighted ovum and nonhydatidiform mole: Secondary | ICD-10-CM

## 2023-02-10 DIAGNOSIS — O26891 Other specified pregnancy related conditions, first trimester: Secondary | ICD-10-CM | POA: Insufficient documentation

## 2023-02-10 DIAGNOSIS — O09291 Supervision of pregnancy with other poor reproductive or obstetric history, first trimester: Secondary | ICD-10-CM | POA: Insufficient documentation

## 2023-02-10 DIAGNOSIS — O09292 Supervision of pregnancy with other poor reproductive or obstetric history, second trimester: Secondary | ICD-10-CM | POA: Insufficient documentation

## 2023-02-10 DIAGNOSIS — Z3A01 Less than 8 weeks gestation of pregnancy: Secondary | ICD-10-CM | POA: Insufficient documentation

## 2023-02-10 LAB — CBC
HCT: 36 % (ref 36.0–46.0)
Hemoglobin: 12 g/dL (ref 12.0–15.0)
MCH: 29.6 pg (ref 26.0–34.0)
MCHC: 33.3 g/dL (ref 30.0–36.0)
MCV: 88.7 fL (ref 80.0–100.0)
Platelets: 240 10*3/uL (ref 150–400)
RBC: 4.06 MIL/uL (ref 3.87–5.11)
RDW: 13.2 % (ref 11.5–15.5)
WBC: 6.7 10*3/uL (ref 4.0–10.5)
nRBC: 0 % (ref 0.0–0.2)

## 2023-02-10 LAB — WET PREP, GENITAL
Clue Cells Wet Prep HPF POC: NONE SEEN
Sperm: NONE SEEN
Trich, Wet Prep: NONE SEEN
WBC, Wet Prep HPF POC: <10 (ref <10)
Yeast Wet Prep HPF POC: NONE SEEN

## 2023-02-10 LAB — URINALYSIS, ROUTINE W REFLEX MICROSCOPIC
Bilirubin Urine: NEGATIVE
Glucose, UA: NEGATIVE mg/dL
Hgb urine dipstick: NEGATIVE
Ketones, ur: 80 mg/dL — AB
Leukocytes,Ua: NEGATIVE
Nitrite: NEGATIVE
Protein, ur: NEGATIVE mg/dL
Specific Gravity, Urine: 1.024 (ref 1.005–1.030)
pH: 5 (ref 5.0–8.0)

## 2023-02-10 LAB — ABO/RH: ABO/RH(D): O POS

## 2023-02-10 LAB — HCG, QUANTITATIVE, PREGNANCY: hCG, Beta Chain, Quant, S: 552 m[IU]/mL — ABNORMAL HIGH (ref <5)

## 2023-02-10 NOTE — MAU Provider Note (Signed)
History     CSN: [id number]  Arrival date and time: 02/10/23 1311   Event Date/Time   First Provider Initiated Contact with Patient 02/10/23 1350      Chief Complaint  Patient presents with   Abdominal Pain   Denise Terry , a  32 y.o. [id number] at [redacted]w[redacted]d presents to MAU after being seen at the [facility] Niece for a 6 week [place]. Patient states she has a history of an ectopic pregnancy (2020) as well as a septic abortion (2017) and is very concerned. She states that after nothing was visualized on ultrasound at [facility] that they recommended she be seen in MAU. Patient denies vaginal bleeding or abnormal vaginal discharge. She states that she is experiencing some "very mild, not concerning abdominal cramping." Currently rates a 2/10. No attempts at relieving pain. She is anxious and just worried that her previous D&C may cause reoccurring ectopic pregnancies.         Abdominal Pain Pertinent negatives include no constipation, diarrhea, dysuria, fever, headaches, nausea or vomiting.    OB History     Gravida  4   Para  1   Term  1   Preterm      AB  2   Living  1      SAB      IAB  1   Ectopic  1   Multiple  0   Live Births  1           Past Medical History:  Diagnosis Date   Anemia    Ectopic pregnancy 2020   methotrexate   Retained products of conception following abortion 06/04/2016   Vaginal delivery 07/18/2014    Past Surgical History:  Procedure Laterality Date   DILATION AND EVACUATION N/A 06/04/2016   Procedure: DILATATION AND EVACUATION;  Surgeon: Hoover Browns, MD;  Location: [facility];  Service: Gynecology;  Laterality: N/A;   THERAPEUTIC ABORTION      Family History  Problem Relation Age of Onset   Hypertension Mother    Diabetes Father    Diabetes Maternal Grandmother    Hypertension Maternal Grandmother    Diabetes Maternal Grandfather    Hypertension Maternal Grandfather    Cancer Paternal Grandmother        breast   Hearing  loss Neg Hx     Social History   Tobacco Use   Smoking status: Never   Smokeless tobacco: Never  Vaping Use   Vaping Use: Never used  Substance Use Topics   Alcohol use: Not Currently   Drug use: No    Allergies:  Allergies  Allergen Reactions   Penicillins Hives and Rash    Has patient had a PCN reaction causing immediate rash, facial/tongue/throat swelling, SOB or lightheadedness with hypotension: NO Has patient had a PCN reaction causing severe rash involving mucus membranes or skin necrosis: No Has patient had a PCN reaction that required hospitalization No Has patient had a PCN reaction occurring within the last 10 years: No If all of the above answers are "NO", then may proceed with Cephalosporin use.     Medications Prior to Admission  Medication Sig Dispense Refill Last Dose   Prenatal Vit-Fe Fumarate-FA (MULTIVITAMIN-PRENATAL) 27-0.8 MG TABS tablet Take 1 tablet by mouth daily at 12 noon.   02/10/2023    Review of Systems  Constitutional:  Negative for chills, fatigue and fever.  Eyes:  Negative for pain and visual disturbance.  Respiratory:  Negative for apnea, shortness  of breath and wheezing.   Cardiovascular:  Negative for chest pain and palpitations.  Gastrointestinal:  Positive for abdominal pain. Negative for constipation, diarrhea, nausea and vomiting.  Genitourinary:  Negative for difficulty urinating, dysuria, pelvic pain, vaginal bleeding, vaginal discharge and vaginal pain.  Musculoskeletal:  Negative for back pain.  Neurological:  Negative for seizures, weakness and headaches.  Psychiatric/Behavioral:  Negative for suicidal ideas.    Physical Exam   Blood pressure 114/63, pulse 85, temperature 98.1 F (36.7 C), temperature source Oral, resp. rate 16, height 5\' 10"  (1.778 m), weight 74 kg, last menstrual period 12/30/2022, SpO2 99 %, unknown if currently breastfeeding.  Physical Exam Vitals and nursing note reviewed.  Constitutional:      General:  She is not in acute distress.    Appearance: Normal appearance.  HENT:     Head: Normocephalic.  Pulmonary:     Effort: Pulmonary effort is normal.  Musculoskeletal:     Cervical back: Normal range of motion.  Skin:    General: Skin is warm and dry.  Neurological:     Mental Status: She is alert and oriented to person, place, and time.  Psychiatric:        Mood and Affect: Mood normal.     MAU Course  Procedures Orders Placed This Encounter  Procedures   Wet prep, genital   US OB LESS THAN 14 WEEKS WITH OB TRANSVAGINAL   CBC   hCG, quantitative, pregnancy   Urinalysis, Routine w reflex microscopic -Urine, Clean Catch   Diet NPO time specified   ABO/Rh   Discharge patient   Results for orders placed or performed during the hospital encounter of 02/10/23 (from the past 24 hour(s))  Urinalysis, Routine w reflex microscopic -Urine, Clean Catch     Status: Abnormal   Collection Time: 02/10/23  1:39 PM  Result Value Ref Range   Color, Urine YELLOW YELLOW   APPearance HAZY (A) CLEAR   Specific Gravity, Urine 1.024 1.005 - 1.030   pH 5.0 5.0 - 8.0   Glucose, UA NEGATIVE NEGATIVE mg/dL   Hgb urine dipstick NEGATIVE NEGATIVE   Bilirubin Urine NEGATIVE NEGATIVE   Ketones, ur 80 (A) NEGATIVE mg/dL   Protein, ur NEGATIVE NEGATIVE mg/dL   Nitrite NEGATIVE NEGATIVE   Leukocytes,Ua NEGATIVE NEGATIVE  ABO/Rh     Status: None   Collection Time: 02/10/23  1:43 PM  Result Value Ref Range   ABO/RH(D) O POS    No rh immune globuloin      NOT A RH IMMUNE GLOBULIN CANDIDATE, PT RH POSITIVE Performed at [facility], [address], [place], [place] [address]   CBC     Status: None   Collection Time: 02/10/23  1:45 PM  Result Value Ref Range   WBC 6.7 4.0 - 10.5 K/uL   RBC 4.06 3.87 - 5.11 MIL/uL   Hemoglobin 12.0 12.0 - 15.0 g/dL   HCT 02.7 25.3 - 66.4 %   MCV 88.7 80.0 - 100.0 fL   MCH 29.6 26.0 - 34.0 pg   MCHC 33.3 30.0 - 36.0 g/dL   RDW 40.3 47.4 - 25.9 %   Platelets  240 150 - 400 K/uL   nRBC 0.0 0.0 - 0.2 %  hCG, quantitative, pregnancy     Status: Abnormal   Collection Time: 02/10/23  1:45 PM  Result Value Ref Range   hCG, Beta Chain, Quant, S 552 (H) <5 mIU/mL  Wet prep, genital     Status:  None   Collection Time: 02/10/23  1:45 PM  Result Value Ref Range   Yeast Wet Prep HPF POC NONE SEEN NONE SEEN   Trich, Wet Prep NONE SEEN NONE SEEN   Clue Cells Wet Prep HPF POC NONE SEEN NONE SEEN   WBC, Wet Prep HPF POC <10 <10   Sperm NONE SEEN    US OB LESS THAN 14 WEEKS WITH OB TRANSVAGINAL  Result Date: 02/10/2023 CLINICAL DATA:  Pain.  Positive pregnancy test EXAM: OBSTETRIC <14 WK [place] AND TRANSVAGINAL OB US TECHNIQUE: Both transabdominal and transvaginal ultrasound examinations were performed for complete evaluation of the gestation as well as the maternal uterus, adnexal regions, and pelvic cul-de-sac. Transvaginal technique was performed to assess early pregnancy. COMPARISON:  12/20/2018 FINDINGS: Intrauterine gestational sac: Small fluid collection along the endometrium Yolk sac:  Not Visualized. Embryo:  Not Visualized. Maternal uterus/adnexae: Retroverted uterus. Heterogeneous myometrium. There is small fluid collection along the endometrium measuring 4 x 3 x 4 mm. Endometrial stripe otherwise measures 14 mm in thickness. Trace free fluid in the pelvis. Right ovary measures 3.1 x 1.7 by 2.2 cm and has small follicles. Left ovary appears similar and measures 2.7 x 1.2 by 0.9 Cm. IMPRESSION: Small fluid collection along the endometrium measuring up to 4 mm. No fetal pole or yolk sac. This could be an early IUP although not confirmed. Trace free fluid in the pelvis. In principle, with a positive pregnancy test and no definitive IUP, ectopic is not excluded. Recommend close follow-up with serial beta HCG and ultrasound. Retroverted uterus Electronically Signed   By: Karen Kays M.D.   On: 02/10/2023 14:49    MDM - Wet prep normal  - UA positive for ketones and  hazy otherwise normal  - CBC normal. Patient hemodynamically stable  - Quant 552 - [place] results revealed a questionable IUP. The recommendation is for repeat quant in 48 hours.  - Plan for discharge .   Assessment and Plan  -  1. Empty gestational sac with ongoing pregnancy   2. [redacted] weeks gestation of pregnancy   3. Abdominal cramping   4. Elevated serum hCG    - Reviewed results with patient and discussed possible outcomes related. Discussed pregnancy too early to identify versus non-viable pregnancy versus ectopic  and the recommendation to repeat quant in 48 hours.  - Appointment made with MAU given weekend follow up.  - Strict ectopic precautions provided. - Worsening signs and return precautions reviewed.  - Patient discharged home in stable options and may return to MAU as needed.    Claudette Head, MSN CNM  02/10/2023, 3:21 PM

## 2023-02-10 NOTE — MAU Note (Addendum)
Denise Terry is a 32 y.o. at Unknown here in MAU reporting: preg confirmed at [facility], has paperwork (copy made and sent to medical records).  Went in for first Korea today because she is 6 wks, they couldn't see anything.  Has hx of ectopic preg, so they sent her here. Occ twinges/cramps. No bleeding LMP: 4/26 Onset of complaint: yesterday Pain score: 2, occ twing/cramps Vitals:   02/10/23 1328  BP: 114/63  Pulse: 85  Resp: 16  Temp: 98.1 F (36.7 C)  SpO2: 99%      Lab orders placed from triage:  urine

## 2023-02-12 ENCOUNTER — Encounter (HOSPITAL_COMMUNITY)
Admission: RE | Admit: 2023-02-12 | Discharge: 2023-02-12 | Disposition: A | Discharge status: Home / Self Care | Payer: BLUE CROSS/BLUE SHIELD | Attending: Obstetrics and Gynecology | Admitting: Obstetrics and Gynecology

## 2023-02-12 ENCOUNTER — Inpatient Hospital Stay (HOSPITAL_COMMUNITY)
Admission: AD | Admit: 2023-02-12 | Discharge: 2023-02-12 | Disposition: A | Discharge status: Home / Self Care | Payer: BLUE CROSS/BLUE SHIELD | Attending: Obstetrics and Gynecology | Admitting: Obstetrics and Gynecology

## 2023-02-12 ENCOUNTER — Encounter (HOSPITAL_COMMUNITY): Payer: Self-pay | Admitting: Obstetrics and Gynecology

## 2023-02-12 DIAGNOSIS — O020 Blighted ovum and nonhydatidiform mole: Secondary | ICD-10-CM | POA: Insufficient documentation

## 2023-02-12 DIAGNOSIS — O3680X0 Pregnancy with inconclusive fetal viability, not applicable or unspecified: Secondary | ICD-10-CM | POA: Diagnosis not present

## 2023-02-12 DIAGNOSIS — Z3A01 Less than 8 weeks gestation of pregnancy: Secondary | ICD-10-CM | POA: Insufficient documentation

## 2023-02-12 LAB — HCG, QUANTITATIVE, PREGNANCY: hCG, Beta Chain, Quant, S: 477 m[IU]/mL — ABNORMAL HIGH (ref <5)

## 2023-02-12 NOTE — MAU Note (Signed)
.  Denise Terry is a 32 y.o. at [redacted]w[redacted]d here in MAU reporting: Here for repeat hCG. Patient was evaluation in MAU 6/7 with pelvic cramps. She reports her pain is occasional and rates 1/10 across her lower abdomen that is occasional. Endorses very light brown discharge after IC last night. Denies other complaints. US findings below.   hCG on 6/7: 552 mIU/mL U/S Impression 6/7: -Small fluid collection along the endometrium measuring up to 4 mm. No fetal pole or yolk sac. This could be an early IUP although not confirmed. Trace free fluid in the pelvis.  -In principle, with a positive pregnancy test and no definitive IUP, ectopic is not excluded. Recommend close follow-up with serial beta HCG and ultrasound.  -Retroverted uterus  Pain score: Denies current pain. Lab orders placed from triage: placed by CNM

## 2023-02-12 NOTE — MAU Provider Note (Signed)
History   Chief Complaint:  Follow-up   SUSANA DUELL is  32 y.o. [id number] Patient's last menstrual period was 12/30/2022.Marland Kitchen Patient is here for follow up of quantitative HCG and ongoing surveillance of pregnancy status. She is [redacted]w[redacted]d weeks gestation by LMP.    Since her last visit, the patient is without new complaint. The patient reports bleeding as  none now.  She denies any pain.  General ROS:  negative  Her previous Quantitative HCG values are:  Recent Labs  Lab 02/10/23 1345  HCGBETAQNT 552*     Physical Exam   Blood pressure 100/67, pulse 88, temperature 98 F (36.7 C), temperature source Oral, resp. rate 15, height 5\' 10"  (1.778 m), weight 73.7 kg, last menstrual period 12/30/2022, SpO2 100 %, unknown if currently breastfeeding.  Focused Gynecological Exam: examination not indicated  Labs: Results for orders placed or performed during the hospital encounter of 02/12/23 (from the past 24 hour(s))  hCG, quantitative, pregnancy   Collection Time: 02/12/23 10:35 AM  Result Value Ref Range   hCG, Beta Chain, Quant, S 477 (H) <5 mIU/mL    Ultrasound Studies:   US OB LESS THAN 14 WEEKS WITH OB TRANSVAGINAL  Result Date: 02/10/2023 CLINICAL DATA:  Pain.  Positive pregnancy test EXAM: OBSTETRIC <14 WK [place] AND TRANSVAGINAL OB US TECHNIQUE: Both transabdominal and transvaginal ultrasound examinations were performed for complete evaluation of the gestation as well as the maternal uterus, adnexal regions, and pelvic cul-de-sac. Transvaginal technique was performed to assess early pregnancy. COMPARISON:  12/20/2018 FINDINGS: Intrauterine gestational sac: Small fluid collection along the endometrium Yolk sac:  Not Visualized. Embryo:  Not Visualized. Maternal uterus/adnexae: Retroverted uterus. Heterogeneous myometrium. There is small fluid collection along the endometrium measuring 4 x 3 x 4 mm. Endometrial stripe otherwise measures 14 mm in thickness. Trace free fluid in the pelvis.  Right ovary measures 3.1 x 1.7 by 2.2 cm and has small follicles. Left ovary appears similar and measures 2.7 x 1.2 by 0.9 Cm. IMPRESSION: Small fluid collection along the endometrium measuring up to 4 mm. No fetal pole or yolk sac. This could be an early IUP although not confirmed. Trace free fluid in the pelvis. In principle, with a positive pregnancy test and no definitive IUP, ectopic is not excluded. Recommend close follow-up with serial beta HCG and ultrasound. Retroverted uterus Electronically Signed   By: Karen Kays M.D.   On: 02/10/2023 14:49    Assessment:   1. Empty gestational sac with ongoing pregnancy     Plan: -Discharge home in stable condition -MAB precautions discussed -Patient advised to follow-up with MAU for repeat HCG on Tuesday before she goes to work has to report to work at [employer] (patient plans to get here between [phone]) -Advised that if HCG continues to decrease by small amounts or by 50%, she may opt medical management or expectant management -Patient may return to MAU as needed or if her condition were to change or worsen  Raelyn Mora, CNM 02/12/2023, 10:39 AM

## 2023-02-13 LAB — GC/CHLAMYDIA PROBE AMP (~~LOC~~) NOT AT ARMC
Chlamydia: NEGATIVE
Comment: NEGATIVE
Comment: NORMAL
Neisseria Gonorrhea: NEGATIVE

## 2023-02-14 ENCOUNTER — Inpatient Hospital Stay (HOSPITAL_COMMUNITY): Payer: BLUE CROSS/BLUE SHIELD

## 2023-02-14 ENCOUNTER — Other Ambulatory Visit (HOSPITAL_COMMUNITY): Payer: BLUE CROSS/BLUE SHIELD | Attending: Obstetrics and Gynecology

## 2023-02-14 ENCOUNTER — Inpatient Hospital Stay (HOSPITAL_COMMUNITY)
Admission: AD | Admit: 2023-02-14 | Discharge: 2023-02-14 | Disposition: A | Discharge status: Home / Self Care | Payer: BLUE CROSS/BLUE SHIELD | Attending: Obstetrics and Gynecology | Admitting: Obstetrics and Gynecology

## 2023-02-14 DIAGNOSIS — O09291 Supervision of pregnancy with other poor reproductive or obstetric history, first trimester: Secondary | ICD-10-CM | POA: Diagnosis not present

## 2023-02-14 DIAGNOSIS — Z3A01 Less than 8 weeks gestation of pregnancy: Secondary | ICD-10-CM | POA: Diagnosis not present

## 2023-02-14 DIAGNOSIS — N939 Abnormal uterine and vaginal bleeding, unspecified: Secondary | ICD-10-CM

## 2023-02-14 DIAGNOSIS — R1030 Lower abdominal pain, unspecified: Secondary | ICD-10-CM | POA: Diagnosis not present

## 2023-02-14 DIAGNOSIS — O200 Threatened abortion: Secondary | ICD-10-CM | POA: Diagnosis not present

## 2023-02-14 DIAGNOSIS — O26891 Other specified pregnancy related conditions, first trimester: Secondary | ICD-10-CM | POA: Diagnosis not present

## 2023-02-14 DIAGNOSIS — R109 Unspecified abdominal pain: Secondary | ICD-10-CM

## 2023-02-14 LAB — COMPREHENSIVE METABOLIC PANEL
ALT: 15 U/L (ref 0–44)
AST: 19 U/L (ref 15–41)
Albumin: 3.6 g/dL (ref 3.5–5.0)
Alkaline Phosphatase: 54 U/L (ref 38–126)
Anion gap: 9 (ref 5–15)
BUN: 8 mg/dL (ref 6–20)
CO2: 26 mmol/L (ref 22–32)
Calcium: 8.8 mg/dL — ABNORMAL LOW (ref 8.9–10.3)
Chloride: 100 mmol/L (ref 98–111)
Creatinine, Ser: 0.75 mg/dL (ref 0.44–1.00)
GFR, Estimated: >60 mL/min (ref >60)
Glucose, Bld: 94 mg/dL (ref 70–99)
Potassium: 3.5 mmol/L (ref 3.5–5.1)
Sodium: 135 mmol/L (ref 135–145)
Total Bilirubin: 0.2 mg/dL — ABNORMAL LOW (ref 0.3–1.2)
Total Protein: 6.5 g/dL (ref 6.5–8.1)

## 2023-02-14 LAB — HCG, QUANTITATIVE, PREGNANCY: hCG, Beta Chain, Quant, S: 535 m[IU]/mL — ABNORMAL HIGH (ref <5)

## 2023-02-14 NOTE — MAU Note (Signed)
.  Denise Terry is a 32 y.o. at [redacted]w[redacted]d here in MAU reporting: Here for repeat beta hCG. Denies new complaints. Reports occasional lower abdominal cramps that she rates a 1/10. Also seeing occasional brown discharge.  Pain score: Denies current pain. Lab orders placed from triage:  none

## 2023-02-14 NOTE — MAU Provider Note (Signed)
History     CSN: [id number]  Arrival date and time: 02/14/23 1649   None     Chief Complaint  Patient presents with   Follow-up   HPI  [name] is a 32 y.o. female [phone] @ [redacted]w[redacted]d here for a follow up Quant. She has been followed for abnormal quants. She has small amount of brown discharge and 1/10 lower abdominal pain. She feels her symptoms are improving. She was originally seen on 6/7 with similar complaints. She was sent from the pregnancy network for evaluation of 6/7. She has a history of ectopic pregnancy in 2020 and an abortion in 2017.   Quants:  6/7: 552 6/9: 477 6/11: 535   OB History     Gravida  4   Para  1   Term  1   Preterm      AB  2   Living  1      SAB      IAB  1   Ectopic  1   Multiple  0   Live Births  1           Past Medical History:  Diagnosis Date   Anemia    Ectopic pregnancy 2020   methotrexate   Retained products of conception following abortion 06/04/2016   Vaginal delivery 07/18/2014    Past Surgical History:  Procedure Laterality Date   DILATION AND EVACUATION N/A 06/04/2016   Procedure: DILATATION AND EVACUATION;  Surgeon: Hoover Browns, MD;  Location: [facility];  Service: Gynecology;  Laterality: N/A;   THERAPEUTIC ABORTION      Family History  Problem Relation Age of Onset   Hypertension Mother    Diabetes Father    Diabetes Maternal Grandmother    Hypertension Maternal Grandmother    Diabetes Maternal Grandfather    Hypertension Maternal Grandfather    Cancer Paternal Grandmother        breast   Hearing loss Neg Hx     Social History   Tobacco Use   Smoking status: Never   Smokeless tobacco: Never  Vaping Use   Vaping Use: Never used  Substance Use Topics   Alcohol use: Not Currently   Drug use: No    Allergies:  Allergies  Allergen Reactions   Penicillins Hives and Rash    Has patient had a PCN reaction causing immediate rash, facial/tongue/throat swelling, SOB or  lightheadedness with hypotension: NO Has patient had a PCN reaction causing severe rash involving mucus membranes or skin necrosis: No Has patient had a PCN reaction that required hospitalization No Has patient had a PCN reaction occurring within the last 10 years: No If all of the above answers are "NO", then may proceed with Cephalosporin use.     Medications Prior to Admission  Medication Sig Dispense Refill Last Dose   Prenatal Vit-Fe Fumarate-FA (MULTIVITAMIN-PRENATAL) 27-0.8 MG TABS tablet Take 1 tablet by mouth daily at 12 noon.      No results found for this or any previous visit (from the past 48 hour(s)).   Review of Systems  Constitutional:  Negative for fever.  Gastrointestinal:  Positive for abdominal pain.  Genitourinary:  Negative for vaginal bleeding.   Physical Exam   Blood pressure 108/64, pulse 98, temperature 98.4 F (36.9 C), temperature source Oral, resp. rate 17, height 5\' 10"  (1.778 m), weight 74.6 kg, last menstrual period 12/30/2022, SpO2 100 %, unknown if currently breastfeeding.  Physical Exam Constitutional:      General: She  is not in acute distress.    Appearance: Normal appearance. She is not ill-appearing, toxic-appearing or diaphoretic.  Abdominal:     Palpations: Abdomen is soft.     Tenderness: There is no abdominal tenderness.  Skin:    General: Skin is warm.  Neurological:     Mental Status: She is alert and oriented to person, place, and time.  Psychiatric:        Behavior: Behavior normal.    MAU Course  Procedures  MDM  O positive blood type.  Spoke to Dr. Alysia Penna and Dr. Jolayne Panther  Dr. Jolayne Panther recommends repeat US today prior to offering the patient treatment.  [place] today shows IUGS no yolk sac. Discussed results with Dr. Alysia Penna who recommends the patient have a follow up US in 10-14 days.  Following [place] the patient reports increase in vaginal bleeding like a period.   Assessment and Plan   A:  1. Threatened miscarriage   2.  Vaginal spotting   3. Abdominal pain, unspecified abdominal location      P:  Dc home Return to MAU for emergencies Repeat US scheduled for 6/19 Support given   Venia Carbon I, NP 02/18/2023 5:25 PM

## 2023-02-14 NOTE — MAU Note (Signed)
Patient given discharge instructions and discussed with RN. Pt signed documentation.

## 2023-02-14 NOTE — MAU Note (Signed)
Venia Carbon NP in Family Rm to discuss test results with pt and further plan of care.

## 2023-02-22 ENCOUNTER — Other Ambulatory Visit (HOSPITAL_COMMUNITY): Payer: Self-pay | Admitting: Obstetrics and Gynecology

## 2023-02-22 ENCOUNTER — Ambulatory Visit (HOSPITAL_COMMUNITY)
Admission: RE | Admit: 2023-02-22 | Discharge: 2023-02-22 | Disposition: A | Discharge status: Home / Self Care | Payer: BLUE CROSS/BLUE SHIELD | Source: Ambulatory Visit | Attending: Obstetrics and Gynecology | Admitting: Obstetrics and Gynecology

## 2023-02-22 DIAGNOSIS — N939 Abnormal uterine and vaginal bleeding, unspecified: Secondary | ICD-10-CM | POA: Insufficient documentation

## 2023-02-22 DIAGNOSIS — N920 Excessive and frequent menstruation with regular cycle: Secondary | ICD-10-CM | POA: Diagnosis not present

## 2023-02-22 DIAGNOSIS — O200 Threatened abortion: Secondary | ICD-10-CM | POA: Insufficient documentation

## 2023-02-22 DIAGNOSIS — R109 Unspecified abdominal pain: Secondary | ICD-10-CM | POA: Diagnosis not present
